# Patient Record
Sex: Male | Born: 1950 | Race: White | Hispanic: No | Smoking: Former smoker
Health system: Southern US, Community
[De-identification: ages and names within clinical notes are randomized; demographics above are authoritative.]

## PROBLEM LIST (undated history)

## (undated) DIAGNOSIS — M109 Gout, unspecified: Secondary | ICD-10-CM

## (undated) HISTORY — PX: HERNIA REPAIR: SHX51

## (undated) HISTORY — PX: KNEE SURGERY: SHX244

## (undated) HISTORY — PX: TONSILECTOMY/ADENOIDECTOMY WITH MYRINGOTOMY: SHX6125

## (undated) HISTORY — PX: VASECTOMY: SHX75

---
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
## 2003-11-06 ENCOUNTER — Ambulatory Visit (HOSPITAL_COMMUNITY): Admission: RE | Admit: 2003-11-06 | Discharge: 2003-11-06 | Payer: Self-pay | Admitting: Internal Medicine

## 2004-08-29 ENCOUNTER — Ambulatory Visit (HOSPITAL_COMMUNITY): Admission: RE | Admit: 2004-08-29 | Discharge: 2004-08-29 | Payer: Self-pay | Admitting: Internal Medicine

## 2004-12-07 ENCOUNTER — Ambulatory Visit: Payer: Self-pay | Admitting: Internal Medicine

## 2005-01-04 ENCOUNTER — Ambulatory Visit (HOSPITAL_COMMUNITY): Admission: RE | Admit: 2005-01-04 | Discharge: 2005-01-04 | Payer: Self-pay | Admitting: Internal Medicine

## 2005-01-04 ENCOUNTER — Ambulatory Visit: Payer: Self-pay | Admitting: Internal Medicine

## 2009-09-22 ENCOUNTER — Ambulatory Visit: Payer: Self-pay | Admitting: Orthopedic Surgery

## 2009-09-22 DIAGNOSIS — M6580 Other synovitis and tenosynovitis, unspecified site: Secondary | ICD-10-CM | POA: Insufficient documentation

## 2010-08-29 ENCOUNTER — Other Ambulatory Visit: Admission: RE | Admit: 2010-08-29 | Discharge: 2010-08-29 | Payer: Self-pay | Admitting: Orthopaedic Surgery

## 2011-05-05 NOTE — Consult Note (Signed)
[name], [name]                 ACCOUNT NO.:  [id number]   MEDICAL RECORD NO.:  [id number]          PATIENT TYPE:  OUT   LOCATION:  RAD                           FACILITY:  [facility]   PHYSICIAN:  Lionel December, M.D.    DATE OF BIRTH:  January 21, 1951   DATE OF CONSULTATION:  DATE OF DISCHARGE:                                   CONSULTATION   HISTORY OF PRESENT ILLNESS:  [name] is a 60 year old gentleman who has had  chronic intermittent hematochezia.  This has been going on for over 15  years.  He was seen previously by Dr. Karilyn Cota and underwent colonoscopy on  November 17, 1994, which revealed a small 4 to 5 mm polyp in the sigmoid  colon which was hyperplastic.  He also has external hemorrhoids.  A flexible  sigmoidoscopy prior to that revealed distal proctitis and external  hemorrhoids.  He is having pain with rectal bleeding.  He describes it as  small to moderate volume at times.  He denies any rectal pain, abdominal  pain, constipation, diarrhea, melena, nausea or vomiting.  He has only  intermittent heartburn symptoms.  He recently had a rectal examination by  Dr. Ouida Sills which revealed external hemorrhoids and heme-positive stool.   CURRENT MEDICATIONS:  1.  Multivitamin daily.  2.  Liquid ___________.   ALLERGIES:  PENICILLIN.   PAST MEDICAL HISTORY:  Hemorrhoids.  Hepatitis A at age 23, hyperlipidemia,  diet controlled.  Gout.  Status post right inguinal hernia repair.  He also  had surgery for a broken collar bone.   FAMILY HISTORY:  Mother had breast cancer and current history.  Father had  leukemia.   SOCIAL HISTORY:  He is divorced.  Quit smoking about two years ago.  He  denies any alcohol use.   REVIEW OF SYSTEMS:  See HPI for GI.  Cardiopulmonary:  Denies any chest pain  or shortness of breath.   PHYSICAL EXAMINATION:  VITAL SIGNS:  Weight 223 pounds.  Temperature 97.5,  blood pressure 112/70, pulse 64.  GENERAL:  A pleasant, well-nourished, well-developed Caucasian  male, in no  acute distress.  SKIN:  Warm and dry, no jaundice.  HEENT:  Conjunctivae are pink.  Sclerae are nonicteric. Oropharyngeal mucosa  is moist and pink, no lesions, erythema or exudate.  No lymphadenopathy or  thyromegaly.  CHEST:  Lungs are clear to auscultation.  CARDIAC:  Exam reveals regular rate and rhythm, normal S1 and S2.  No  murmurs, rubs or gallops.  ABDOMEN:  Positive bowel sounds, soft, nontender, nondistended.  No  organomegaly or masses.  EXTREMITIES:  No edema.   IMPRESSION:  [name] is a 60 year old gentleman with chronic intermittent  hematochezia felt to be due to hemorrhoids.  He recently had hemoccult-  positive stool on rectal examination.  Given that it has been 10 years since  his last colonoscopy, he is due for one now.   PLAN:  1.  Colonoscopy in the near future.  2.  We will obtain most recent CBC or H&H from Dr. Alonza Smoker office.     Lesl  LL/MEDQ  D:  12/07/2004  T:  12/07/2004  Job:  [id number]   cc:   Kingsley Callander. Ouida Sills, MD  [address]  [place]  [place] [address]  Fax: [phone]

## 2011-05-05 NOTE — Op Note (Signed)
NAMEJEREMAIH, KLIMA                 ACCOUNT NO.:  [id number]   MEDICAL RECORD NO.:  [id number]          PATIENT TYPE:  AMB   LOCATION:  DAY                           FACILITY:  [facility]   PHYSICIAN:  Lionel December, M.D.    DATE OF BIRTH:  [birth year]   DATE OF PROCEDURE:  01/04/2005  DATE OF DISCHARGE:                                 OPERATIVE REPORT   PROCEDURE:  Total colonoscopy.   INDICATIONS:  [name] is a 60 year old Caucasian male with  chronic/intermittent hematochezia, who is undergoing a diagnostic  colonoscopy.  He has history of constipation but states lately his bowels  have been moving regularly.  His last colonoscopy was 10 years ago, and he  had a small hyperplastic polyp removed.  Family history is negative for  colorectal carcinoma, but his father did have colonic polyps.  The procedure  risks were reviewed with the patient and informed consent was obtained.   PREMEDICATION:  Demerol 50 mg IV, Versed 5 mg IV.   FINDINGS:  Procedure performed in endoscopy suite.  The patient's vital  signs and O2 saturation were monitored during procedure and remained stable.  The patient was placed in left lateral recumbent position and a rectal  examination performed.  No abnormality noted on external or digital exam.  The Olympus video scope was placed in the rectum and advanced under vision  in the sigmoid colon and beyond.  Preparation was satisfactory.  He had  diffuse pigmentation consistent with melanosis coli.  He had few scattered  diverticula throughout the colon, and most of these are in the proximal  transverse colon.  The scope was passed to the cecum, which was identified  by appendiceal orifice and ileocecal valve.  Pictures taken for the record.  The mucosa was examined for the second time on the way out, and there were  no polyps and/or tumor masses.  Rectal mucosa as normal.  The scope was  retroflexed to examine the anorectal junction, and he had small to moderate-  sized hemorrhoids below the dentate line.  The endoscope was straightened  and withdrawn.  The patient tolerated the procedure well.   FINAL DIAGNOSIS:  1.  Pancolonic diverticulosis.  He only has a few scattered diverticula.  2.  Melanosis coli.  3.  External hemorrhoids, felt to be the source of his hematochezia.   RECOMMENDATIONS:  1.  A high-fiber diet.  2.  Citrucel or equivalent one tablespoonful daily.  If necessary, he can      also take Colace two tablets at bedtime.  3.  He should continue yearly Hemoccults and consider next colonoscopy in 10      years from now.      NR/MEDQ  D:  01/04/2005  T:  01/04/2005  Job:  [id number]   cc:   Kingsley Callander. Ouida Sills, MD  [address]  [place]  [place] [address]  Fax: [phone]

## 2012-10-24 ENCOUNTER — Ambulatory Visit (INDEPENDENT_AMBULATORY_CARE_PROVIDER_SITE_OTHER): Payer: BC Managed Care – PPO | Admitting: Orthopedic Surgery

## 2012-10-24 ENCOUNTER — Encounter: Payer: Self-pay | Admitting: Orthopedic Surgery

## 2012-10-24 ENCOUNTER — Ambulatory Visit (INDEPENDENT_AMBULATORY_CARE_PROVIDER_SITE_OTHER): Payer: BC Managed Care – PPO

## 2012-10-24 VITALS — Ht 76.0 in | Wt 220.0 lb

## 2012-10-24 DIAGNOSIS — M23359 Other meniscus derangements, posterior horn of lateral meniscus, unspecified knee: Secondary | ICD-10-CM

## 2012-10-24 DIAGNOSIS — M25569 Pain in unspecified knee: Secondary | ICD-10-CM

## 2012-10-24 NOTE — Progress Notes (Signed)
Patient ID: [name], male   DOB: 03-27-1951, 61 y.o.   MRN: [id number] New patient visit  61 year old male seen over 3 years ago for pain on the lateral side of his left knee was treated for iliotibial band friction syndrome presents now with continued pain in the same area with dull throbbing burning sensation over the lateral joint line with catching and locking when he twists bends or crosses his leg. The pain is worse with activity relieved by rest. Intensity of pain 5/10. Previous treatment includes anti-inflammatories, therapy, activity modification including stopping riding bikes. Current review of systems are ordering of the eyes stiffness of the joint numbness and tingling in the leg allergy to penicillin  History reviewed. No pertinent past medical history.  Past Surgical History  Procedure Date  . Hernia repair   . Tonsilectomy/adenoidectomy with myringotomy   . Knee surgery     Ht 6\' 4"  (1.93 m)  Wt 220 lb (99.791 kg)  BMI 26.78 kg/m2 He is a well-developed well-nourished male grooming and hygiene is intact. He is awake alert and oriented x3 mood and affect are normal. He ambulates with slight limp.  He has a slight swelling over the lateral joint line tenderness. He has full range of motion painful flexion test 120. The knee remains stable strength is normal skin is intact his McMurray sign is positive for lateral joint line pain and clicking and popping;  sensation is normal no pathologic reflexes were elicited. Lymph nodes are negative. Balance is normal.  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

## 2012-10-24 NOTE — Patient Instructions (Addendum)
You have been scheduled for an MRI scan.  Your insurance company requires a precertification prior to scheduling the MRI.  If the MRI scan is not approved we will let you know and make further treatment recommendations according to your insurance's guidelines.   We will schedule you for another  appointment to review the results and make further treatment recommendations  

## 2012-10-28 ENCOUNTER — Telehealth: Payer: Self-pay | Admitting: Radiology

## 2012-10-28 NOTE — Telephone Encounter (Signed)
Patient has MRI appointment at [facility] on 10-30-12 at 2:45. Patient has [employer], authorization # [id number] and it expires on 11-26-12. Patient will follow back up in the office for results.

## 2012-10-30 ENCOUNTER — Ambulatory Visit (HOSPITAL_COMMUNITY)
Admission: RE | Admit: 2012-10-30 | Discharge: 2012-10-30 | Disposition: A | Discharge status: Home / Self Care | Payer: BC Managed Care – PPO | Source: Ambulatory Visit | Attending: Orthopedic Surgery | Admitting: Orthopedic Surgery

## 2012-10-30 ENCOUNTER — Ambulatory Visit (HOSPITAL_COMMUNITY): Admission: RE | Admit: 2012-10-30 | Payer: BC Managed Care – PPO | Source: Ambulatory Visit

## 2012-10-30 DIAGNOSIS — M1710 Unilateral primary osteoarthritis, unspecified knee: Secondary | ICD-10-CM | POA: Insufficient documentation

## 2012-10-30 DIAGNOSIS — M1120 Other chondrocalcinosis, unspecified site: Secondary | ICD-10-CM | POA: Insufficient documentation

## 2012-10-30 DIAGNOSIS — IMO0002 Reserved for concepts with insufficient information to code with codable children: Secondary | ICD-10-CM | POA: Insufficient documentation

## 2012-10-30 DIAGNOSIS — M7120 Synovial cyst of popliteal space [Baker], unspecified knee: Secondary | ICD-10-CM | POA: Insufficient documentation

## 2012-10-30 DIAGNOSIS — M25569 Pain in unspecified knee: Secondary | ICD-10-CM | POA: Insufficient documentation

## 2012-10-30 DIAGNOSIS — M23359 Other meniscus derangements, posterior horn of lateral meniscus, unspecified knee: Secondary | ICD-10-CM

## 2012-11-06 ENCOUNTER — Encounter: Payer: Self-pay | Admitting: Orthopedic Surgery

## 2012-11-06 ENCOUNTER — Ambulatory Visit (INDEPENDENT_AMBULATORY_CARE_PROVIDER_SITE_OTHER): Payer: BC Managed Care – PPO | Admitting: Orthopedic Surgery

## 2012-11-06 VITALS — Ht 76.0 in | Wt 220.0 lb

## 2012-11-06 DIAGNOSIS — M1712 Unilateral primary osteoarthritis, left knee: Secondary | ICD-10-CM

## 2012-11-06 DIAGNOSIS — M1710 Unilateral primary osteoarthritis, unspecified knee: Secondary | ICD-10-CM

## 2012-11-06 DIAGNOSIS — IMO0002 Reserved for concepts with insufficient information to code with codable children: Secondary | ICD-10-CM

## 2012-11-06 NOTE — Patient Instructions (Addendum)
activities as tolerated 

## 2012-11-06 NOTE — Progress Notes (Signed)
Patient ID: [name], male   DOB: [birth year], 61 y.o.   MRN: [id number] Chief Complaint  Patient presents with  . Follow-up    MRI results of left knee.    IMPRESSION:  1. I suspect a large partial medial meniscectomy, with only a  minuscule strand of residual meniscal tissue in the posterior horn  and midbody, and a small amount of residual anterior horn tissue.  2. Lateral meniscal chondrocalcinosis likely accounts for the high  signal centrally in the meniscus. Slight free edge irregularity  may represent fraying.  3. Osteoarthritis.  4. Small knee effusion with small Baker's cyst.  5. Mildly thickened medial plica.  Patient supine and lateral knee pain but eventually got better when he crossed and uncrosses leg and her loud pop  He does not have a torn meniscus so I suspect this represents degenerative change in the lateral compartment  He did not want an injection today since his knee is getting better  I asked him to call us back if things worsen and he would like to try injection  At this point I do not think arthroscopic surgery would help he does have increased arthrosis in the medial compartment secondary to previous medial meniscectomy from an arthroscopic procedure done 20 years ago  At this point we will follow him on an as-needed basis

## 2013-06-24 NOTE — H&P (Signed)
  NTS SOAP Note  Vital Signs:  Vitals as of: 06/24/2013: Systolic 128: Diastolic 77: Heart Rate 55: Temp 98.76F: Height 41ft 4in: Weight 217Lbs 0 Ounces: Pain Level 4: BMI 26.41  BMI : 26.41 kg/m2  Subjective: This 62 Years 67 Months old Male presents for of    HEMORRHOIDS: ,Present with protruding hemorrhoids.  States they have been problematic for some time now.  Occassional blood on toilet paper when he wipes himself.  Has had a thrombosed hemorrhoid lanced years ago.  Had TCS five years ago.  Review of Symptoms:  Constitutional:unremarkable   Head:unremarkable    Eyes:unremarkable   Nose/Mouth/Throat:unremarkable Cardiovascular:  unremarkable   Respiratory:unremarkable   Gastrointestinal:  unremarkable   Genitourinary:unremarkable     Musculoskeletal:unremarkable   Skin:unremarkable Hematolgic/Lymphatic:unremarkable     Allergic/Immunologic:unremarkable     Past Medical History:    Reviewed   Past Medical History  Surgical History: hernia, knee surgery Medical Problems: none Allergies: pcn Medications: none   Social History:Reviewed  Social History  Preferred Language: English Race:  White Ethnicity: Not Hispanic / Latino Age: 62 Years 9 Months Marital Status:  S Alcohol: socially Recreational drug(s):  No   Smoking Status: Unknown if ever smoked reviewed on 06/24/2013 Functional Status reviewed on mm/dd/yyyy ------------------------------------------------ Bathing: Normal Cooking: Normal Dressing: Normal Driving: Normal Eating: Normal Managing Meds: Normal Oral Care: Normal Shopping: Normal Toileting: Normal Transferring: Normal Walking: Normal Cognitive Status reviewed on mm/dd/yyyy ------------------------------------------------ Attention: Normal Decision Making: Normal Language: Normal Memory: Normal Motor: Normal Perception: Normal Problem Solving: Normal Visual and Spatial: Normal   Family  History:  Reviewed  Family Health History Mother, Deceased; Cancer unspecified;  Father, Deceased; Leukemia;     Objective Information: General:  Well appearing, well nourished in no distress. Heart:  RRR, no murmur Lungs:    CTA bilaterally, no wheezes, rhonchi, rales.  Breathing unlabored.   Internal and external hemorrhoid noted posteriorly.  Normal sphincter tone, heme negative  Assessment:Hemorrhoidal disease  Diagnosis &amp; Procedure Smart Code   Plan:Scheduled for hemorrhoidectomy on 07/04/13.   Patient Education:Alternative treatments to surgery were discussed with patient (and family).  Risks and benefits  of procedure including bleeding and recurrence of the hemorrhoids were fully explained to the patient (and family) who gave informed consent. Patient/family questions were addressed.  Follow-up:Pending Surgery

## 2013-06-26 ENCOUNTER — Encounter (HOSPITAL_COMMUNITY): Payer: Self-pay | Admitting: Pharmacy Technician

## 2013-06-27 NOTE — Patient Instructions (Signed)
[name]  06/27/2013   Your procedure is scheduled on:  07/04/13  Report to [facility] at 07:15 AM.  Call this number if you have problems the morning of surgery: [phone]   Remember:   Do not eat food or drink liquids after midnight.   Take these medicines the morning of surgery with A SIP OF WATER: None   Do not wear jewelry, make-up or nail polish.  Do not wear lotions, powders, or perfumes.   Do not shave 48 hours prior to surgery. Men may shave face and neck.  Do not bring valuables to the hospital.  [facility] is not responsible for any belongings or valuables.  Contacts, dentures or bridgework may not be worn into surgery.  Leave suitcase in the car. After surgery it may be brought to your room.  For patients admitted to the hospital, checkout time is 11:00 AM the day of discharge.   Patients discharged the day of surgery will not be allowed to drive home.   Special Instructions: Shower using CHG 2 nights before surgery and the night before surgery.  If you shower the day of surgery use CHG.  Use special wash - you have one bottle of CHG for all showers.  You should use approximately 1/3 of the bottle for each shower.   Please read over the following fact sheets that you were given: Pain Booklet, Surgical Site Infection Prevention, Anesthesia Post-op Instructions and Care and Recovery After Surgery    Hemorrhoidectomy Hemorrhoidectomy is surgery to remove hemorrhoids. Hemorrhoids are veins that have become swollen in the rectum. The rectum is the area from the bottom end of the intestines to the opening where bowel movements leave the body. Hemorrhoids can be uncomfortable. They can cause itching, bleeding and pain if a blood clot forms in them (thrombose). If hemorrhoids are small, surgery may not be needed. But if they cover a larger area, surgery is usually suggested.  LET YOUR CAREGIVER KNOW ABOUT:   Any allergies.  All medications you are taking, including:  Herbs,  eyedrops, over-the-counter medications and creams.  Blood thinners (anticoagulants), aspirin or other drugs that could affect blood clotting.  Use of steroids (by mouth or as creams).  Previous problems with anesthetics, including local anesthetics.  Possibility of pregnancy, if this applies.  Any history of blood clots.  Any history of bleeding or other blood problems.  Previous surgery.  Smoking history.  Other health problems. RISKS AND COMPLICATIONS All surgery carries some risk. However, hemorrhoid surgery usually goes smoothly. Possible complications could include:  Urinary retention.  Bleeding.  Infection.  A painful incision.  A reaction to the anesthesia (this is not common). BEFORE THE PROCEDURE   Stop using aspirin and non-steroidal anti-inflammatory drugs (NSAIDs) for pain relief. This includes prescription drugs and over-the-counter drugs such as ibuprofen and naproxen. Also stop taking vitamin E. If possible, do this two weeks before your surgery.  If you take blood-thinners, ask your healthcare provider when you should stop taking them.  You will probably have blood and urine tests done several days before your surgery.  Do not eat or drink for about 8 hours before the surgery.  Arrive at least an hour before the surgery, or whenever your surgeon recommends. This will give you time to check in and fill out any needed paperwork.  Hemorrhoidectomy is often an outpatient procedure. This means you will be able to go home the same day. Sometimes, though, people stay overnight in the hospital  after the procedure. Ask your surgeon what to expect. Either way, make arrangements in advance for someone to drive you home. PROCEDURE   The preparation:  You will change into a hospital gown.  You will be given an IV. A needle will be inserted in your arm. Medication can flow directly into your body through this needle.  You might be given an enema to clear your  rectum.  Once in the operating room, you will probably lie on your side or be repositioned later to lying on your stomach.  You will be given anesthesia (medication) so you will not feel anything during the surgery. The surgery often is done with local anesthesia (the area near the hemorrhoids will be numb and you will be drowsy but awake). Sometimes, general anesthesia is used (you will be asleep during the procedure).  The procedure:  There are a few different procedures for hemorrhoids. Be sure to ask you surgeon about the procedure, the risks and benefits.  Be sure to ask about what you need to do to take care of the wound, if there is one. AFTER THE PROCEDURE  You will stay in a recovery area until the anesthesia has worn off. Your blood pressure and pulse will be checked every so often.  You may feel a lot of pain in the area of the rectum.  Take all pain medication prescribed by your surgeon. Ask before taking any over-the-counter pain medicines.  Sometimes sitting in a warm bath can help relieve your pain.  To make sure you have bowel movements without straining:  You will probably need to take stool softeners (usually a pill) for a few days.  You should drink 8 to 10 glasses of water each day.  Your activity will be restricted for awhile. Ask your caregiver for a list of what you should and should not do while you recover. Document Released: 10/01/2009 Document Revised: 02/26/2012 Document Reviewed: 10/01/2009 [facility] Patient Information 2014 [place], [place].    PATIENT INSTRUCTIONS POST-ANESTHESIA  IMMEDIATELY FOLLOWING SURGERY:  Do not drive or operate machinery for the first twenty four hours after surgery.  Do not make any important decisions for twenty four hours after surgery or while taking narcotic pain medications or sedatives.  If you develop intractable nausea and vomiting or a severe headache please notify your doctor immediately.  FOLLOW-UP:  Please make  an appointment with your surgeon as instructed. You do not need to follow up with anesthesia unless specifically instructed to do so.  WOUND CARE INSTRUCTIONS (if applicable):  Keep a dry clean dressing on the anesthesia/puncture wound site if there is drainage.  Once the wound has quit draining you may leave it open to air.  Generally you should leave the bandage intact for twenty four hours unless there is drainage.  If the epidural site drains for more than 36-48 hours please call the anesthesia department.  QUESTIONS?:  Please feel free to call your physician or the hospital operator if you have any questions, and they will be happy to assist you.

## 2013-06-30 ENCOUNTER — Other Ambulatory Visit: Payer: Self-pay

## 2013-06-30 ENCOUNTER — Encounter (HOSPITAL_COMMUNITY): Payer: Self-pay

## 2013-06-30 ENCOUNTER — Encounter (HOSPITAL_COMMUNITY)
Admission: RE | Admit: 2013-06-30 | Discharge: 2013-06-30 | Disposition: A | Discharge status: Home / Self Care | Payer: BC Managed Care – PPO | Source: Ambulatory Visit | Attending: General Surgery | Admitting: General Surgery

## 2013-06-30 LAB — CBC WITH DIFFERENTIAL/PLATELET
Basophils Absolute: 0.1 10*3/uL (ref 0.0–0.1)
Basophils Relative: 1 % (ref 0–1)
Eosinophils Absolute: 0.4 10*3/uL (ref 0.0–0.7)
Eosinophils Relative: 4 % (ref 0–5)
HCT: 42.7 % (ref 39.0–52.0)
Hemoglobin: 14.7 g/dL (ref 13.0–17.0)
Lymphocytes Relative: 23 % (ref 12–46)
Lymphs Abs: 2.2 10*3/uL (ref 0.7–4.0)
MCH: 29.9 pg (ref 26.0–34.0)
MCHC: 34.4 g/dL (ref 30.0–36.0)
MCV: 87 fL (ref 78.0–100.0)
Monocytes Absolute: 1 10*3/uL (ref 0.1–1.0)
Monocytes Relative: 10 % (ref 3–12)
Neutro Abs: 6.3 10*3/uL (ref 1.7–7.7)
Neutrophils Relative %: 64 % (ref 43–77)
Platelets: 261 10*3/uL (ref 150–400)
RBC: 4.91 MIL/uL (ref 4.22–5.81)
RDW: 12.4 % (ref 11.5–15.5)
WBC: 9.9 10*3/uL (ref 4.0–10.5)

## 2013-06-30 LAB — BASIC METABOLIC PANEL
BUN: 14 mg/dL (ref 6–23)
CO2: 26 mEq/L (ref 19–32)
Calcium: 9.9 mg/dL (ref 8.4–10.5)
Chloride: 104 mEq/L (ref 96–112)
Creatinine, Ser: 1.13 mg/dL (ref 0.50–1.35)
GFR calc Af Amer: 80 mL/min — ABNORMAL LOW (ref >90)
GFR calc non Af Amer: 69 mL/min — ABNORMAL LOW (ref >90)
Glucose, Bld: 110 mg/dL — ABNORMAL HIGH (ref 70–99)
Potassium: 4.2 mEq/L (ref 3.5–5.1)
Sodium: 139 mEq/L (ref 135–145)

## 2013-07-04 ENCOUNTER — Encounter (HOSPITAL_COMMUNITY)
Admission: RE | Disposition: A | Discharge status: Home / Self Care | Payer: Self-pay | Source: Ambulatory Visit | Attending: General Surgery

## 2013-07-04 ENCOUNTER — Encounter (HOSPITAL_COMMUNITY): Payer: Self-pay | Admitting: Anesthesiology

## 2013-07-04 ENCOUNTER — Ambulatory Visit (HOSPITAL_COMMUNITY)
Admission: RE | Admit: 2013-07-04 | Discharge: 2013-07-04 | Disposition: A | Discharge status: Home / Self Care | Payer: BC Managed Care – PPO | Source: Ambulatory Visit | Attending: General Surgery | Admitting: General Surgery

## 2013-07-04 ENCOUNTER — Ambulatory Visit (HOSPITAL_COMMUNITY): Payer: BC Managed Care – PPO | Admitting: Anesthesiology

## 2013-07-04 DIAGNOSIS — K644 Residual hemorrhoidal skin tags: Secondary | ICD-10-CM | POA: Insufficient documentation

## 2013-07-04 DIAGNOSIS — Z87891 Personal history of nicotine dependence: Secondary | ICD-10-CM | POA: Insufficient documentation

## 2013-07-04 DIAGNOSIS — K648 Other hemorrhoids: Secondary | ICD-10-CM | POA: Insufficient documentation

## 2013-07-04 HISTORY — PX: HEMORRHOID SURGERY: SHX153

## 2013-07-04 SURGERY — HEMORRHOIDECTOMY
Anesthesia: General | Site: Rectum | Wound class: Clean Contaminated

## 2013-07-04 MED ORDER — ARTIFICIAL TEARS OP OINT
TOPICAL_OINTMENT | OPHTHALMIC | Status: AC
  Filled 2013-07-04: qty 3.5

## 2013-07-04 MED ORDER — LIDOCAINE HCL (CARDIAC) 20 MG/ML IV SOLN
INTRAVENOUS | Status: DC | PRN
  Administered 2013-07-04: 50 mg via INTRAVENOUS

## 2013-07-04 MED ORDER — GLYCOPYRROLATE 0.2 MG/ML IJ SOLN
INTRAMUSCULAR | Status: AC
  Filled 2013-07-04: qty 1

## 2013-07-04 MED ORDER — BUPIVACAINE HCL (PF) 0.5 % IJ SOLN
INTRAMUSCULAR | Status: DC | PRN
  Administered 2013-07-04: 7 mL

## 2013-07-04 MED ORDER — SODIUM CHLORIDE 0.9 % IR SOLN
Status: DC | PRN
  Administered 2013-07-04: 1000 mL

## 2013-07-04 MED ORDER — MIDAZOLAM HCL 2 MG/2ML IJ SOLN
INTRAMUSCULAR | Status: AC
  Filled 2013-07-04: qty 2

## 2013-07-04 MED ORDER — OXYCODONE-ACETAMINOPHEN 7.5-325 MG PO TABS: 1.0000 | ORAL_TABLET | ORAL | Status: AC | PRN

## 2013-07-04 MED ORDER — PROPOFOL 10 MG/ML IV BOLUS
INTRAVENOUS | Status: DC | PRN
  Administered 2013-07-04: 150 mg via INTRAVENOUS

## 2013-07-04 MED ORDER — FENTANYL CITRATE 0.05 MG/ML IJ SOLN
25.0000 ug | INTRAMUSCULAR | Status: AC
  Administered 2013-07-04: 25 ug via INTRAVENOUS

## 2013-07-04 MED ORDER — FENTANYL CITRATE 0.05 MG/ML IJ SOLN
INTRAMUSCULAR | Status: AC
  Filled 2013-07-04: qty 2

## 2013-07-04 MED ORDER — KETOROLAC TROMETHAMINE 30 MG/ML IJ SOLN
INTRAMUSCULAR | Status: AC
  Filled 2013-07-04: qty 1

## 2013-07-04 MED ORDER — ONDANSETRON HCL 4 MG/2ML IJ SOLN: 4.0000 mg | Freq: Once | INTRAMUSCULAR | Status: DC | PRN

## 2013-07-04 MED ORDER — LIDOCAINE VISCOUS 2 % MT SOLN
OROMUCOSAL | Status: DC | PRN
  Administered 2013-07-04: 1

## 2013-07-04 MED ORDER — METRONIDAZOLE IN NACL 5-0.79 MG/ML-% IV SOLN
500.0000 mg | INTRAVENOUS | Status: AC
  Administered 2013-07-04: 500 mg via INTRAVENOUS

## 2013-07-04 MED ORDER — METRONIDAZOLE IN NACL 5-0.79 MG/ML-% IV SOLN
INTRAVENOUS | Status: AC
  Filled 2013-07-04: qty 100

## 2013-07-04 MED ORDER — PROPOFOL 10 MG/ML IV EMUL
INTRAVENOUS | Status: AC
  Filled 2013-07-04: qty 20

## 2013-07-04 MED ORDER — FENTANYL CITRATE 0.05 MG/ML IJ SOLN
INTRAMUSCULAR | Status: DC | PRN
  Administered 2013-07-04: 50 ug via INTRAVENOUS
  Administered 2013-07-04 (×4): 25 ug via INTRAVENOUS
  Administered 2013-07-04: 50 ug via INTRAVENOUS

## 2013-07-04 MED ORDER — ONDANSETRON HCL 4 MG/2ML IJ SOLN
INTRAMUSCULAR | Status: AC
  Filled 2013-07-04: qty 2

## 2013-07-04 MED ORDER — LACTATED RINGERS IV SOLN
INTRAVENOUS | Status: DC | PRN
  Administered 2013-07-04 (×2): via INTRAVENOUS

## 2013-07-04 MED ORDER — HEMOSTATIC AGENTS (NO CHARGE) OPTIME
TOPICAL | Status: DC | PRN
  Administered 2013-07-04: 1 via TOPICAL

## 2013-07-04 MED ORDER — ONDANSETRON HCL 4 MG/2ML IJ SOLN
4.0000 mg | Freq: Once | INTRAMUSCULAR | Status: AC
  Administered 2013-07-04: 4 mg via INTRAVENOUS

## 2013-07-04 MED ORDER — LIDOCAINE VISCOUS 2 % MT SOLN
OROMUCOSAL | Status: AC
  Filled 2013-07-04: qty 15

## 2013-07-04 MED ORDER — MIDAZOLAM HCL 2 MG/2ML IJ SOLN
1.0000 mg | INTRAMUSCULAR | Status: DC | PRN
  Administered 2013-07-04: 2 mg via INTRAVENOUS

## 2013-07-04 MED ORDER — CHLORHEXIDINE GLUCONATE 4 % EX LIQD: 1.0000 "application " | Freq: Once | CUTANEOUS | Status: DC

## 2013-07-04 MED ORDER — NALOXONE HCL 0.4 MG/ML IJ SOLN
INTRAMUSCULAR | Status: AC
  Filled 2013-07-04: qty 1

## 2013-07-04 MED ORDER — KETOROLAC TROMETHAMINE 30 MG/ML IJ SOLN
30.0000 mg | Freq: Once | INTRAMUSCULAR | Status: AC
  Administered 2013-07-04: 30 mg via INTRAVENOUS

## 2013-07-04 MED ORDER — BUPIVACAINE HCL (PF) 0.5 % IJ SOLN
INTRAMUSCULAR | Status: AC
  Filled 2013-07-04: qty 30

## 2013-07-04 MED ORDER — GLYCOPYRROLATE 0.2 MG/ML IJ SOLN
INTRAMUSCULAR | Status: DC | PRN
  Administered 2013-07-04 (×2): 0.2 mg via INTRAVENOUS

## 2013-07-04 MED ORDER — FENTANYL CITRATE 0.05 MG/ML IJ SOLN
25.0000 ug | INTRAMUSCULAR | Status: DC | PRN
  Administered 2013-07-04 (×2): 50 ug via INTRAVENOUS

## 2013-07-04 MED ORDER — LACTATED RINGERS IV SOLN
INTRAVENOUS | Status: DC
  Administered 2013-07-04: 09:00:00 via INTRAVENOUS

## 2013-07-04 SURGICAL SUPPLY — 29 items
BAG HAMPER (MISCELLANEOUS) ×2 IMPLANT
CLOTH BEACON ORANGE TIMEOUT ST (SAFETY) ×2 IMPLANT
COVER LIGHT HANDLE STERIS (MISCELLANEOUS) ×4 IMPLANT
DECANTER SPIKE VIAL GLASS SM (MISCELLANEOUS) ×2 IMPLANT
DRAPE PROXIMA HALF (DRAPES) ×2 IMPLANT
ELECT REM PT RETURN 9FT ADLT (ELECTROSURGICAL) ×2
ELECTRODE REM PT RTRN 9FT ADLT (ELECTROSURGICAL) ×1 IMPLANT
FORMALIN 10 PREFIL 120ML (MISCELLANEOUS) ×2 IMPLANT
GLOVE BIO SURGEON STRL SZ7.5 (GLOVE) ×2 IMPLANT
GLOVE BIOGEL PI IND STRL 7.5 (GLOVE) IMPLANT
GLOVE BIOGEL PI INDICATOR 7.5 (GLOVE) ×1
GLOVE ECLIPSE 7.0 STRL STRAW (GLOVE) ×1 IMPLANT
GLOVE EXAM NITRILE MD LF STRL (GLOVE) ×1 IMPLANT
GOWN STRL REIN XL XLG (GOWN DISPOSABLE) ×5 IMPLANT
HEMOSTAT SURGICEL 4X8 (HEMOSTASIS) ×2 IMPLANT
KIT ROOM TURNOVER AP CYSTO (KITS) ×2 IMPLANT
LIGASURE IMPACT 36 18CM CVD LR (INSTRUMENTS) ×1 IMPLANT
MANIFOLD NEPTUNE II (INSTRUMENTS) ×2 IMPLANT
NDL HYPO 25X1 1.5 SAFETY (NEEDLE) ×1 IMPLANT
NEEDLE HYPO 25X1 1.5 SAFETY (NEEDLE) ×2 IMPLANT
NS IRRIG 1000ML POUR BTL (IV SOLUTION) ×2 IMPLANT
PACK PERI GYN (CUSTOM PROCEDURE TRAY) ×2 IMPLANT
PAD ARMBOARD 7.5X6 YLW CONV (MISCELLANEOUS) ×2 IMPLANT
SET BASIN LINEN APH (SET/KITS/TRAYS/PACK) ×2 IMPLANT
SHEARS HARMONIC 9CM CVD (BLADE) IMPLANT
SPONGE GAUZE 4X4 12PLY (GAUZE/BANDAGES/DRESSINGS) ×2 IMPLANT
SUT SILK 0 FSL (SUTURE) ×2 IMPLANT
SUT VIC AB 2-0 CT2 27 (SUTURE) IMPLANT
SYR CONTROL 10ML LL (SYRINGE) ×2 IMPLANT

## 2013-07-04 NOTE — Transfer of Care (Signed)
Immediate Anesthesia Transfer of Care Note  Patient: [name]  Procedure(s) Performed: Procedure(s): HEMORRHOIDECTOMY (N/A)  Patient Location: PACU  Anesthesia Type:General  Level of Consciousness: awake, alert , oriented and patient cooperative  Airway & Oxygen Therapy: Patient Spontanous Breathing and Patient connected to face mask oxygen  Post-op Assessment: Report given to PACU RN and Post -op Vital signs reviewed and stable  Post vital signs: Reviewed and stable  Complications: No apparent anesthesia complications

## 2013-07-04 NOTE — Op Note (Signed)
Patient:  [name]  DOB:  Dec 26, 1950  MRN:  [id number]   Preop Diagnosis:  Hemorrhoidal disease  Postop Diagnosis:  Same  Procedure:  Extensive hemorrhoidectomy  Surgeon:  Franky Macho, M.D.  Anes:  General  Indications:  Patient is a 62 year old white male who presents with both internal and external hemorrhoidal disease at both the 4:00 and 7:00 positions. The risks and benefits of the procedure including bleeding, infection, and recurrence of hemorrhoidal disease were fully explained to the patient, who gave informed consent.  Procedure note:  The patient was placed in the lithotomy position after general anesthesia was administered. The perineum was prepped and draped using usual sterile technique with Betadine. Surgical site confirmation was performed.  On anoscopy, a large internal and external hemorrhoid was noted at the 4:00 position with an additional smaller external hemorrhoid noted at the 7 o'clock position. No other mass lesions were noted. Using the LigaSure, both areas were excised without difficulty. They were sent to pathology further examination. The internal sphincter was noted to be intact at the end of the procedure. 0.5% Sensorcaine was instilled the surrounding peritoneum. Surgicel and Viscous Xylocaine rectal packing was then placed.  All tape and needle counts were correct at the end of the procedure. Patient was awakened and transferred to PACU in stable condition.  Complications:  None  EBL:  Minimal  Specimen:  Hemorrhoids

## 2013-07-04 NOTE — Anesthesia Procedure Notes (Signed)
Procedure Name: LMA Insertion Date/Time: 07/04/2013 9:07 AM Performed by: Carolyne Littles, Lula Michaux L Pre-anesthesia Checklist: Patient identified, Timeout performed, Emergency Drugs available, Suction available and Patient being monitored Patient Re-evaluated:Patient Re-evaluated prior to inductionOxygen Delivery Method: Circle system utilized Preoxygenation: Pre-oxygenation with 100% oxygen Intubation Type: IV induction Ventilation: Mask ventilation without difficulty LMA Size: 5.0 Number of attempts: 1 Placement Confirmation: breath sounds checked- equal and bilateral and positive ETCO2 Tube secured with: Tape Dental Injury: Teeth and Oropharynx as per pre-operative assessment

## 2013-07-04 NOTE — Preoperative (Signed)
Beta Blockers   Reason not to administer Beta Blockers:Not Applicable 

## 2013-07-04 NOTE — Anesthesia Preprocedure Evaluation (Signed)
Anesthesia Evaluation  Patient identified by MRN, date of birth, ID band Patient awake    Reviewed: Allergy & Precautions, H&P , NPO status , Patient's Chart, lab work & pertinent test results  Airway Mallampati: I TM Distance: >3 FB Neck ROM: Full    Dental  (+) Teeth Intact   Pulmonary neg pulmonary ROS, former smoker,  breath sounds clear to auscultation        Cardiovascular negative cardio ROS  Rhythm:Regular Rate:Normal     Neuro/Psych    GI/Hepatic negative GI ROS,   Endo/Other    Renal/GU      Musculoskeletal   Abdominal   Peds  Hematology   Anesthesia Other Findings   Reproductive/Obstetrics                           Anesthesia Physical Anesthesia Plan  ASA: I  Anesthesia Plan: General   Post-op Pain Management:    Induction: Intravenous  Airway Management Planned: LMA  Additional Equipment:   Intra-op Plan:   Post-operative Plan: Extubation in OR  Informed Consent: I have reviewed the patients History and Physical, chart, labs and discussed the procedure including the risks, benefits and alternatives for the proposed anesthesia with the patient or authorized representative who has indicated his/her understanding and acceptance.     Plan Discussed with:   Anesthesia Plan Comments:         Anesthesia Quick Evaluation

## 2013-07-04 NOTE — Interval H&P Note (Signed)
History and Physical Interval Note:  07/04/2013 8:35 AM  [name]  has presented today for surgery, with the diagnosis of hemorrhoid  The various methods of treatment have been discussed with the patient and family. After consideration of risks, benefits and other options for treatment, the patient has consented to  Procedure(s): HEMORRHOIDECTOMY (N/A) as a surgical intervention .  The patient's history has been reviewed, patient examined, no change in status, stable for surgery.  I have reviewed the patient's chart and labs.  Questions were answered to the patient's satisfaction.     [name]

## 2013-07-04 NOTE — Anesthesia Postprocedure Evaluation (Signed)
  Anesthesia Post-op Note  Patient: [name]  Procedure(s) Performed: Procedure(s): HEMORRHOIDECTOMY (N/A)  Patient Location: PACU  Anesthesia Type:General  Level of Consciousness: awake, alert , oriented and patient cooperative  Airway and Oxygen Therapy: Patient Spontanous Breathing and Patient connected to face mask oxygen  Post-op Pain: mild  Post-op Assessment: Post-op Vital signs reviewed, Patient's Cardiovascular Status Stable, RESPIRATORY FUNCTION UNSTABLE, Patent Airway, No signs of Nausea or vomiting and Pain level controlled  Post-op Vital Signs: Reviewed and stable  Complications: No apparent anesthesia complications

## 2013-07-08 ENCOUNTER — Encounter (HOSPITAL_COMMUNITY): Payer: Self-pay | Admitting: General Surgery

## 2013-08-20 ENCOUNTER — Encounter: Payer: Self-pay | Admitting: Orthopedic Surgery

## 2013-08-20 ENCOUNTER — Ambulatory Visit (INDEPENDENT_AMBULATORY_CARE_PROVIDER_SITE_OTHER): Payer: BC Managed Care – PPO | Admitting: Orthopedic Surgery

## 2013-08-20 VITALS — BP 125/84 | Ht 76.0 in | Wt 213.0 lb

## 2013-08-20 DIAGNOSIS — M6530 Trigger finger, unspecified finger: Secondary | ICD-10-CM

## 2013-08-20 NOTE — Patient Instructions (Addendum)
You have received a steroid shot. 15% of patients experience increased pain at the injection site with in the next 24 hours. This is best treated with ice and tylenol extra strength 2 tabs every 8 hours. If you are still having pain please call the office.    

## 2013-08-20 NOTE — Progress Notes (Signed)
Patient ID: [name], male   DOB: 05/10/1951, 62 y.o.   MRN: [id number]  Chief Complaint  Patient presents with  . Hand Pain    Trigger finger left middle finger   HISTORY: 62 year old male presents with pain catching and locking of his left long finger x9 months which came on suddenly and is associated with dull 6/10 intermittent pain depending on his activity. Reports a catching and locking  Review of systems shortness of breath, numbness and tingling stiffness joint pain, ordering of the eyes. Other systems reviewed were negative. History of gout  History of hemorrhoidectomy herniorrhaphy and tonsillectomy family history of heart disease social history divorced, non smoker,  alcohol use no  Physical Exam(12)  Vital signs:   1.GENERAL: normal development   2. CDV: pulses are normal   3. Skin: normal  4. Lymph: nodes were not palpable/normal  5/6. Psychiatric: awake, alert and oriented, mood and affect normal   7. Neuro: normal sensation  8.   MSK  Gait: Normal but noncontributory 9.   Inspection and on the left hand shows catching and locking and tenderness over the A1 pulley with full range of motion on flexion strength and no instability   Imaging no imaging  Assessment: Left long finger    Plan: Injection left long finger triggering  Procedure note trigger finger injection   Procedure note trigger finger injection  Diagnosis trigger finger Postop diagnosis trigger finger Procedure injection of trigger finger Finger injected left long finger  Details of procedure: After verbal consent and timeout to confirm site the leftlong finger was injected with 1 cc of 40 mg of Depo-Medrol and 1 cc of 1% lidocaine The procedure was tolerated well without complication

## 2014-07-23 ENCOUNTER — Encounter: Payer: Self-pay | Admitting: Cardiovascular Disease

## 2014-07-23 ENCOUNTER — Ambulatory Visit (INDEPENDENT_AMBULATORY_CARE_PROVIDER_SITE_OTHER): Payer: BC Managed Care – PPO | Admitting: Cardiovascular Disease

## 2014-07-23 VITALS — BP 144/82 | HR 62 | Ht 76.0 in | Wt 212.0 lb

## 2014-07-23 DIAGNOSIS — R0609 Other forms of dyspnea: Secondary | ICD-10-CM

## 2014-07-23 DIAGNOSIS — I498 Other specified cardiac arrhythmias: Secondary | ICD-10-CM

## 2014-07-23 DIAGNOSIS — Z87891 Personal history of nicotine dependence: Secondary | ICD-10-CM

## 2014-07-23 DIAGNOSIS — R0989 Other specified symptoms and signs involving the circulatory and respiratory systems: Secondary | ICD-10-CM

## 2014-07-23 DIAGNOSIS — R001 Bradycardia, unspecified: Secondary | ICD-10-CM

## 2014-07-23 DIAGNOSIS — J449 Chronic obstructive pulmonary disease, unspecified: Secondary | ICD-10-CM

## 2014-07-23 DIAGNOSIS — IMO0001 Reserved for inherently not codable concepts without codable children: Secondary | ICD-10-CM

## 2014-07-23 DIAGNOSIS — R0600 Dyspnea, unspecified: Secondary | ICD-10-CM

## 2014-07-23 DIAGNOSIS — R030 Elevated blood-pressure reading, without diagnosis of hypertension: Secondary | ICD-10-CM

## 2014-07-23 DIAGNOSIS — R002 Palpitations: Secondary | ICD-10-CM

## 2014-07-23 NOTE — Patient Instructions (Signed)
Your physician recommends that you schedule a follow-up appointment in: only as needed.       Thank you for choosing [facility] !         

## 2014-07-23 NOTE — Progress Notes (Signed)
Patient ID: GRANTLEY SAVAGE, male   DOB: 04-12-1951, 63 y.o.   MRN: [id number]       CARDIOLOGY CONSULT NOTE  Patient ID: RASHON WESTRUP MRN: [id number] DOB/AGE: May 29, 1951 63 y.o.  Admit date: (Not on file) Primary Physician Carylon Perches, MD  Reason for Consultation: bradycardia, dyspnea, palpitations.  HPI: The patient is a 63 year old male with a past history of tobacco abuse who has been referred for the evaluation of bradycardia. He had an ECG on 07/07/2014 which demonstrated sinus bradycardia heart rate 45 beats per minute. He underwent pulmonary function testing on July 21 which demonstrated mild airway obstruction. He tells me he had thyroid blood testing done recently which was normal. He also said he had a Life Scan which reportedly demonstrated normal carotid arterial flow as well as lower extremity arterial flow. He denies chest pain. He denies any significant exertional dyspnea, and says that he may experience some mild shortness of breath if he significantly exerts himself when temperatures and humidity are in the 90s. He very seldom experiences "skipped beats" and also seldom experiences dizziness, and denies syncope altogether. He denies a known history of sleep apnea, and denies symptoms such as orthopnea, leg swelling and paroxysmal nocturnal dyspnea. His father lived to be age of 27 and died of leukemia and also had a low resting heart rate. He reportedly did not have any significant coronary artery disease. The patient says he routinely checks his heart rate in the morning it ranges from 55-58 beats per minute. He said it has been as low as 44 beats per minute but he was asymptomatic. He does not routinely exercise but when the weather is cooler and he golfs, he walks all 18 holes.   Allergies  Allergen Reactions  . Penicillins Other (See Comments)    Unknown     No current outpatient prescriptions on file.   No current facility-administered medications for this visit.     No past medical history on file.  Past Surgical History  Procedure Laterality Date  . Tonsilectomy/adenoidectomy with myringotomy    . Knee surgery Left     arthroscopy  . Hernia repair      Dr Dickey Gave  . Hemorrhoid surgery N/A 07/04/2013    Procedure: HEMORRHOIDECTOMY;  Surgeon: Dalia Heading, MD;  Location: [facility];  Service: General;  Laterality: N/A;    History   Social History  . Marital Status: Divorced    Spouse Name: N/A    Number of Children: N/A  . Years of Education: N/A   Occupational History  . Not on file.   Social History Main Topics  . Smoking status: Former Smoker -- 2.00 packs/day for 30 years    Types: Cigarettes    Quit date: 06/30/2002  . Smokeless tobacco: Not on file  . Alcohol Use: No     Comment: clean for 16 years  . Drug Use: No     Comment: recovering addict cocaone-amphetamines-clean for 16 years  . Sexual Activity: Yes    Birth Control/ Protection: None   Other Topics Concern  . Not on file   Social History Narrative  . No narrative on file     No family history of premature CAD in 1st degree relatives.  Prior to Admission medications   Not on File     Review of systems complete and found to be negative unless listed above in HPI     Physical exam Blood pressure 144/82, pulse 62, height 6\' 4"  (1.93  m), weight 212 lb (96.163 kg), SpO2 98.00%. General: NAD Neck: No JVD, no thyromegaly or thyroid nodule.  Lungs: Clear to auscultation bilaterally with normal respiratory effort. CV: Nondisplaced PMI. Regular rate and rhythm, normal S1/S2, no S3/S4, no murmur.  No peripheral edema.  No carotid bruit.  Normal pedal pulses.  Abdomen: Soft, nontender, no hepatosplenomegaly, no distention.  Skin: Intact without lesions or rashes.  Neurologic: Alert and oriented x 3.  Psych: Normal affect. Extremities: No clubbing or cyanosis.  HEENT: Normal.   Labs:   Lab Results  Component Value Date   WBC 9.9 06/30/2013   HGB 14.7  06/30/2013   HCT 42.7 06/30/2013   MCV 87.0 06/30/2013   PLT 261 06/30/2013   No results found for this basename: NA, K, CL, CO2, BUN, CREATININE, CALCIUM, LABALBU, PROT, BILITOT, ALKPHOS, ALT, AST, GLUCOSE,  in the last 168 hours No results found for this basename: CKTOTAL, CKMB, CKMBINDEX, TROPONINI    No results found for this basename: CHOL   No results found for this basename: HDL   No results found for this basename: LDLCALC   No results found for this basename: TRIG   No results found for this basename: CHOLHDL   No results found for this basename: LDLDIRECT         Studies: No results found.  ASSESSMENT AND PLAN:  1. Asymptomatic sinus bradycardia: It appears he has no history of thyroid disease and no documented sleep apnea. He is entirely asymptomatic and his activities of daily living are on inhibited. I do not feel cardiac testing is warranted at this time. I did, however, inform the patient that should he become symptomatic, I would be glad to follow up with him in the future.  2. Dyspnea: Given his prior h/o tobacco abuse and recently documented mild COPD, I do not feel noninvasive cardiac testing is warranted at this time.  3. Palpitations: Seldom and infrequent. No indication for further testing.  4. Elevated BP: He has no h/o essential HTN. This should be monitored in the future to see if antihypertensive therapy is warranted.  Dispo: f/u prn.  Signed: Prentice DockerSuresh Shyquan Stallbaumer, M.D., F.A.C.C.  07/23/2014, 9:10 AM

## 2015-05-10 ENCOUNTER — Ambulatory Visit (INDEPENDENT_AMBULATORY_CARE_PROVIDER_SITE_OTHER): Payer: BLUE CROSS/BLUE SHIELD | Admitting: Orthopedic Surgery

## 2015-05-10 ENCOUNTER — Encounter: Payer: Self-pay | Admitting: Orthopedic Surgery

## 2015-05-10 VITALS — BP 125/69 | Ht 76.0 in | Wt 215.0 lb

## 2015-05-10 DIAGNOSIS — M6530 Trigger finger, unspecified finger: Secondary | ICD-10-CM

## 2015-05-10 NOTE — Progress Notes (Signed)
Patient ID: [name], male   DOB: December 06, 1951, 64 y.o.   MRN: [id number] Left long finger trigger recurrent requests injection  Injected 08/20/2013  Repeat injection left long finger A1 pulley A steroid injection was per performed at the left long finger A1 pulley using 1% lidocaine and 40 mg of Depo-Medrol with ethyl chloride and alcohol skin prep well tolerated no complications

## 2015-11-28 ENCOUNTER — Emergency Department (HOSPITAL_COMMUNITY)
Admission: EM | Admit: 2015-11-28 | Discharge: 2015-11-28 | Disposition: A | Discharge status: Home / Self Care | Payer: BLUE CROSS/BLUE SHIELD | Attending: Emergency Medicine | Admitting: Emergency Medicine

## 2015-11-28 ENCOUNTER — Encounter (HOSPITAL_COMMUNITY): Payer: Self-pay | Admitting: Emergency Medicine

## 2015-11-28 DIAGNOSIS — N390 Urinary tract infection, site not specified: Secondary | ICD-10-CM

## 2015-11-28 DIAGNOSIS — Z79899 Other long term (current) drug therapy: Secondary | ICD-10-CM | POA: Insufficient documentation

## 2015-11-28 DIAGNOSIS — R51 Headache: Secondary | ICD-10-CM | POA: Diagnosis not present

## 2015-11-28 DIAGNOSIS — M542 Cervicalgia: Secondary | ICD-10-CM | POA: Diagnosis not present

## 2015-11-28 DIAGNOSIS — Z880 Allergy status to penicillin: Secondary | ICD-10-CM | POA: Insufficient documentation

## 2015-11-28 DIAGNOSIS — Z87891 Personal history of nicotine dependence: Secondary | ICD-10-CM | POA: Diagnosis not present

## 2015-11-28 DIAGNOSIS — R319 Hematuria, unspecified: Secondary | ICD-10-CM | POA: Diagnosis present

## 2015-11-28 LAB — CBC WITH DIFFERENTIAL/PLATELET
Basophils Absolute: 0.1 10*3/uL (ref 0.0–0.1)
Basophils Relative: 0 %
EOS ABS: 0.2 10*3/uL (ref 0.0–0.7)
EOS PCT: 1 %
HCT: 43.1 % (ref 39.0–52.0)
Hemoglobin: 15.1 g/dL (ref 13.0–17.0)
LYMPHS ABS: 1.5 10*3/uL (ref 0.7–4.0)
Lymphocytes Relative: 7 %
MCH: 30.2 pg (ref 26.0–34.0)
MCHC: 35 g/dL (ref 30.0–36.0)
MCV: 86.2 fL (ref 78.0–100.0)
MONO ABS: 1.9 10*3/uL — AB (ref 0.1–1.0)
MONOS PCT: 9 %
Neutro Abs: 17.2 10*3/uL — ABNORMAL HIGH (ref 1.7–7.7)
Neutrophils Relative %: 83 %
PLATELETS: 210 10*3/uL (ref 150–400)
RBC: 5 MIL/uL (ref 4.22–5.81)
RDW: 12.2 % (ref 11.5–15.5)
WBC: 20.8 10*3/uL — ABNORMAL HIGH (ref 4.0–10.5)

## 2015-11-28 LAB — COMPREHENSIVE METABOLIC PANEL
ALK PHOS: 73 U/L (ref 38–126)
ALT: 21 U/L (ref 17–63)
ANION GAP: 7 (ref 5–15)
AST: 18 U/L (ref 15–41)
Albumin: 4 g/dL (ref 3.5–5.0)
BUN: 14 mg/dL (ref 6–20)
CALCIUM: 9.3 mg/dL (ref 8.9–10.3)
CO2: 23 mmol/L (ref 22–32)
Chloride: 103 mmol/L (ref 101–111)
Creatinine, Ser: 0.92 mg/dL (ref 0.61–1.24)
GFR calc non Af Amer: >60 mL/min (ref >60)
Glucose, Bld: 111 mg/dL — ABNORMAL HIGH (ref 65–99)
Potassium: 3.6 mmol/L (ref 3.5–5.1)
SODIUM: 133 mmol/L — AB (ref 135–145)
Total Bilirubin: 1.6 mg/dL — ABNORMAL HIGH (ref 0.3–1.2)
Total Protein: 7.2 g/dL (ref 6.5–8.1)

## 2015-11-28 LAB — URINALYSIS, ROUTINE W REFLEX MICROSCOPIC
Glucose, UA: NEGATIVE mg/dL
Ketones, ur: 15 mg/dL — AB
Nitrite: POSITIVE — AB
PH: 5.5 (ref 5.0–8.0)
Protein, ur: >300 mg/dL — AB
SPECIFIC GRAVITY, URINE: 1.015 (ref 1.005–1.030)

## 2015-11-28 LAB — URINE MICROSCOPIC-ADD ON

## 2015-11-28 MED ORDER — SULFAMETHOXAZOLE-TRIMETHOPRIM 800-160 MG PO TABS: 1.0000 | ORAL_TABLET | Freq: Two times a day (BID) | ORAL | Status: AC

## 2015-11-28 MED ORDER — SULFAMETHOXAZOLE-TRIMETHOPRIM 800-160 MG PO TABS
1.0000 | ORAL_TABLET | Freq: Once | ORAL | Status: AC
  Administered 2015-11-28: 1 via ORAL
  Filled 2015-11-28: qty 1

## 2015-11-28 NOTE — ED Provider Notes (Signed)
CSN: [id number]     Arrival date & time 11/28/15  1449 History   First MD Initiated Contact with Patient 11/28/15 1509     Chief Complaint  Patient presents with  . Hematuria     (Consider location/radiation/quality/duration/timing/severity/associated sxs/prior Treatment) Patient is a 64 y.o. male presenting with hematuria.  Hematuria This is a new problem. The current episode started yesterday. The problem occurs constantly. Associated symptoms include abdominal pain and headaches. Pertinent negatives include no chest pain and no shortness of breath. Nothing aggravates the symptoms. Nothing relieves the symptoms.    History reviewed. No pertinent past medical history. Past Surgical History  Procedure Laterality Date  . Tonsilectomy/adenoidectomy with myringotomy    . Knee surgery Left     arthroscopy  . Hernia repair      Dr Dickey Gave  . Hemorrhoid surgery N/A 07/04/2013    Procedure: HEMORRHOIDECTOMY;  Surgeon: Dalia Heading, MD;  Location: [facility];  Service: General;  Laterality: N/A;   History reviewed. No pertinent family history. Social History  Substance Use Topics  . Smoking status: Former Smoker -- 2.00 packs/day for 30 years    Types: Cigarettes    Quit date: 06/30/2002  . Smokeless tobacco: Never Used  . Alcohol Use: No     Comment: clean for 16 years    Review of Systems  Constitutional: Positive for chills. Negative for fever.  Eyes: Negative for pain.  Respiratory: Negative for cough and shortness of breath.   Cardiovascular: Negative for chest pain.  Gastrointestinal: Positive for abdominal pain.  Genitourinary: Positive for hematuria.  Musculoskeletal: Positive for back pain and neck pain.  Skin: Negative for rash and wound.  Neurological: Positive for headaches.  All other systems reviewed and are negative.     Allergies  Penicillins  Home Medications   Prior to Admission medications   Medication Sig Start Date End Date Taking? Authorizing  Provider  Amino Acids (AMINO ACID PO) Take 1 packet by mouth every other day.    Yes Historical Provider, MD  sulfamethoxazole-trimethoprim (BACTRIM DS,SEPTRA DS) 800-160 MG tablet Take 1 tablet by mouth 2 (two) times daily. 11/29/15 12/09/15  Marily Memos, MD   BP 126/64 mmHg  Pulse 68  Temp(Src) 98.5 F (36.9 C) (Oral)  Resp 16  Ht  (1.93 m)  Wt 208 lb (94.348 kg)  BMI 25.33 kg/m2  SpO2 100% Physical Exam  Constitutional: He is oriented to person, place, and time. He appears well-developed and well-nourished.  HENT:  Head: Normocephalic and atraumatic.  Neck: Normal range of motion.  Cardiovascular: Normal rate.   Pulmonary/Chest: Effort normal. No respiratory distress. He has no wheezes.  Abdominal: Soft. He exhibits no distension.  Musculoskeletal: Normal range of motion. He exhibits no edema or tenderness.  Neurological: He is alert and oriented to person, place, and time. No cranial nerve deficit. Coordination normal.  Nursing note and vitals reviewed.   ED Course  Procedures (including critical care time) Labs Review Labs Reviewed  URINALYSIS, ROUTINE W REFLEX MICROSCOPIC (NOT AT [facility]) - Abnormal; Notable for the following:    Color, Urine RED (*)    APPearance CLOUDY (*)    Hgb urine dipstick LARGE (*)    Bilirubin Urine MODERATE (*)    Ketones, ur 15 (*)    Protein, ur >300 (*)    Nitrite POSITIVE (*)    Leukocytes, UA LARGE (*)    All other components within normal limits  CBC WITH DIFFERENTIAL/PLATELET - Abnormal; Notable for the  following:    WBC 20.8 (*)    Neutro Abs 17.2 (*)    Monocytes Absolute 1.9 (*)    All other components within normal limits  COMPREHENSIVE METABOLIC PANEL - Abnormal; Notable for the following:    Sodium 133 (*)    Glucose, Bld 111 (*)    Total Bilirubin 1.6 (*)    All other components within normal limits  URINE MICROSCOPIC-ADD ON - Abnormal; Notable for the following:    Squamous Epithelial / LPF 0-5 (*)    Bacteria, UA  MANY (*)    All other components within normal limits  URINE CULTURE    Imaging Review No results found. I have personally reviewed and evaluated these images and lab results as part of my medical decision-making.   EKG Interpretation None      MDM   Final diagnoses:  UTI (lower urinary tract infection)   Likely UTI. Will check labs. Doubt prostatitis, CA, but will need FU if urine negative.   UTI on labs. Leukocytosis as well. Will tx w/ bactrim as he has PCN allergy.   Will follow up with PCP for repeat UA.      Marily MemosJason Nasya Vincent, MD 12/01/15 [phone]

## 2015-11-28 NOTE — ED Notes (Signed)
Patient c/o pain with urination in which he noticed in blood in urine. Patient reports burning sensation with urgency to urinate but only voiding small amounts at a time. Per patient chills with fever and nausea. Patient reports urinating after checking into ER and noting 3 "small particulates" in toilet after voiding.

## 2015-12-01 LAB — URINE CULTURE: Culture: >=100000

## 2015-12-02 ENCOUNTER — Telehealth (HOSPITAL_BASED_OUTPATIENT_CLINIC_OR_DEPARTMENT_OTHER): Payer: Self-pay | Admitting: Emergency Medicine

## 2015-12-02 ENCOUNTER — Telehealth: Payer: Self-pay | Admitting: Emergency Medicine

## 2015-12-02 NOTE — Telephone Encounter (Signed)
Post ED Visit - Positive Culture Follow-up  Culture report reviewed by antimicrobial stewardship pharmacist:  [x]  [name], Pharm.D. []  [name], Pharm.D., BCPS []  [name], Pharm.D. []  [name], [address] BCPS []  [place] Liu, [address]Ph[address] BCPS, AAHIVP []  [name], Pharm.D., BCPS, AAHIVP []  [name], Pharm.D. []  [name], [address]  Positive urine culture E. coli Treated with bactrim DS, organism sensitive to the same and no further patient follow-up is required at this time.  [name], [name] 12/02/2015, 10:01 AM

## 2017-03-20 ENCOUNTER — Encounter: Payer: Self-pay | Admitting: Orthopedic Surgery

## 2017-03-20 ENCOUNTER — Ambulatory Visit (INDEPENDENT_AMBULATORY_CARE_PROVIDER_SITE_OTHER): Payer: BLUE CROSS/BLUE SHIELD | Admitting: Orthopedic Surgery

## 2017-03-20 ENCOUNTER — Ambulatory Visit (INDEPENDENT_AMBULATORY_CARE_PROVIDER_SITE_OTHER): Payer: BLUE CROSS/BLUE SHIELD

## 2017-03-20 VITALS — BP 127/80 | HR 64 | Ht 75.0 in | Wt 211.0 lb

## 2017-03-20 DIAGNOSIS — M25562 Pain in left knee: Secondary | ICD-10-CM | POA: Diagnosis not present

## 2017-03-20 DIAGNOSIS — S83262A Peripheral tear of lateral meniscus, current injury, left knee, initial encounter: Secondary | ICD-10-CM

## 2017-03-20 DIAGNOSIS — M2342 Loose body in knee, left knee: Secondary | ICD-10-CM | POA: Diagnosis not present

## 2017-03-20 NOTE — Progress Notes (Signed)
New problem. 

## 2017-03-20 NOTE — Progress Notes (Signed)
New problem  Patient ID: [name], male   DOB: Aug 31, 1951, 66 y.o.   MRN: [id number]   Chief complaint is clicking left knee   [name] is a 66 y.o. male.   HPI 66 year old male had a left knee arthroscopy several years ago did well up until about a year ago when he started having a clicking sensation when flexing and extending his left knee with painful lateral joint line. He did not notice any swelling.  It seemed to be worse when he crossed his left leg over his right  Occasionally the knee will catch but there's been no locking or giving way  Review of Systems Review of Systems  Musculoskeletal: Positive for joint pain. Negative for falls.  Neurological: Negative for tingling.    No past medical history on file.  Past Surgical History:  Procedure Laterality Date  . HEMORRHOID SURGERY N/A 07/04/2013   Procedure: HEMORRHOIDECTOMY;  Surgeon: Dalia Heading, MD;  Location: [facility];  Service: General;  Laterality: N/A;  . HERNIA REPAIR     Dr Dickey Gave  . KNEE SURGERY Left    arthroscopy  . TONSILECTOMY/ADENOIDECTOMY WITH MYRINGOTOMY      Allergies  Allergen Reactions  . Penicillins Other (See Comments)    Has patient had a PCN reaction causing immediate rash, facial/tongue/throat swelling, SOB or lightheadedness with hypotension: unknown Has patient had a PCN reaction causing severe rash involving mucus membranes or skin necrosis:unknown Has patient had a PCN reaction that required hospitalization unknown Has patient had a PCN reaction occurring within the last 10 years:unknown If all of the above answers are "NO", then may proceed with Cephalosporin use.     No current outpatient prescriptions on file.   No current facility-administered medications for this visit.      Physical Exam BP 127/80   Pulse 64   Ht  (1.905 m)   Wt 211 lb (95.7 kg)   BMI 26.37 kg/m  Physical Exam   The patient is well developed well nourished and well groomed.    Orientation to person place and time is normal   Mood is pleasant. Affect normal  Ambulatory status is remarkable for NO limp in the involved extremity         LEFT Knee examination:  Inspection: Tenderness is noted over the medial joint line   ROM: Is limited by pain with a maximum flexion arc of 130  Stability: Collateral ligaments are stable, the Lachman test and anterior and posterior drawer tests are normal  We do palpate LATERAL  joint line tenderness and a positive McMurray's for LATERAL meniscal tear   Motor exam: Grade 5 motor strength in the quadriceps musculature   Skin: Warm dry and intact over the right leg                       Neuro: normal sensation   Vascular: 2+ DP pulse with normal color and no edema.   Currently the RIGHT  lower extremity and knee examination revealed no tenderness or swelling, full range of motion without contracture subluxation atrophy or tremor. Normal muscle tone no instability and the neurovascular status of the limb is normal.    Assessment and Plan:  IMAGING: I have read and interpret the xrays as follows: Symmetric medial and lateral joint space narrowing with possible chondrocalcinosis throughout the lateral meniscus   Diagnosis and treatment:   Osteoarthritis of the knee  Torn lateral meniscus  Encounter Diagnoses  Name Primary?  . Left knee pain, unspecified chronicity Yes  . Loose body in knee, left knee   . Peripheral tear of lateral meniscus of left knee as current injury, initial encounter     MRI for possible loose body versus lateral meniscal tear  Fuller Canada, MD 03/20/2017 12:12 PM

## 2017-03-22 ENCOUNTER — Ambulatory Visit (HOSPITAL_COMMUNITY)
Admission: RE | Admit: 2017-03-22 | Discharge: 2017-03-22 | Disposition: A | Discharge status: Home / Self Care | Payer: BLUE CROSS/BLUE SHIELD | Source: Ambulatory Visit | Attending: Orthopedic Surgery | Admitting: Orthopedic Surgery

## 2017-03-22 DIAGNOSIS — M2342 Loose body in knee, left knee: Secondary | ICD-10-CM

## 2017-03-22 DIAGNOSIS — M948X6 Other specified disorders of cartilage, lower leg: Secondary | ICD-10-CM | POA: Diagnosis not present

## 2017-03-22 DIAGNOSIS — X58XXXA Exposure to other specified factors, initial encounter: Secondary | ICD-10-CM | POA: Insufficient documentation

## 2017-03-22 DIAGNOSIS — S83282A Other tear of lateral meniscus, current injury, left knee, initial encounter: Secondary | ICD-10-CM | POA: Insufficient documentation

## 2017-03-30 ENCOUNTER — Ambulatory Visit (INDEPENDENT_AMBULATORY_CARE_PROVIDER_SITE_OTHER): Payer: BLUE CROSS/BLUE SHIELD | Admitting: Orthopedic Surgery

## 2017-03-30 DIAGNOSIS — M2342 Loose body in knee, left knee: Secondary | ICD-10-CM

## 2017-03-30 DIAGNOSIS — M25562 Pain in left knee: Secondary | ICD-10-CM

## 2017-03-30 DIAGNOSIS — S83262A Peripheral tear of lateral meniscus, current injury, left knee, initial encounter: Secondary | ICD-10-CM

## 2017-03-30 NOTE — Progress Notes (Signed)
MRI follow-up left knee  Prior left knee pain  Review of systems no swelling in the knee at this time actually only having minimal discomfort  MRI is reviewed with this report my interpretation is that he has arthritis of the knee has a torn lateral meniscus and he also has a previously torn medial meniscus with some fraying  IMPRESSION: 1. Tricompartmental cartilage abnormalities as described above. 2. Horizontal tear of the anterior horn and body of the lateral meniscus extending to the free edge. 3. Severe attenuation of the posterior horn and body of the medial meniscus which may reflect severe tear versus prior meniscectomy.   Electronically Signed   By: Elige Ko   On: 03/22/2017 10:29  Discussed this with him right now he is doing okay learn to live with it he actually played golf yesterday  So is confident up with his surgery on hold for now and follow-up with [place] on an as-needed basis

## 2017-07-23 ENCOUNTER — Ambulatory Visit: Payer: BLUE CROSS/BLUE SHIELD | Admitting: Orthopedic Surgery

## 2018-04-21 IMAGING — MR MR KNEE*L* W/O CM
4 of 6 series · 13 of 40 positions shown · non-contrast
Comparison: 10/30/2012

CLINICAL DATA: Left knee pain for 30 years

EXAM:
MRI OF THE LEFT KNEE WITHOUT CONTRAST
TECHNIQUE: Multiplanar, multisequence MR imaging of the knee was performed. No
intravenous contrast was administered.

[Series 3: pdfs axial · axial · 3.0mm · 0.19mm/px · z∈[-60,+12]mm · 3 of 30 slices shown]
[im 5/30]
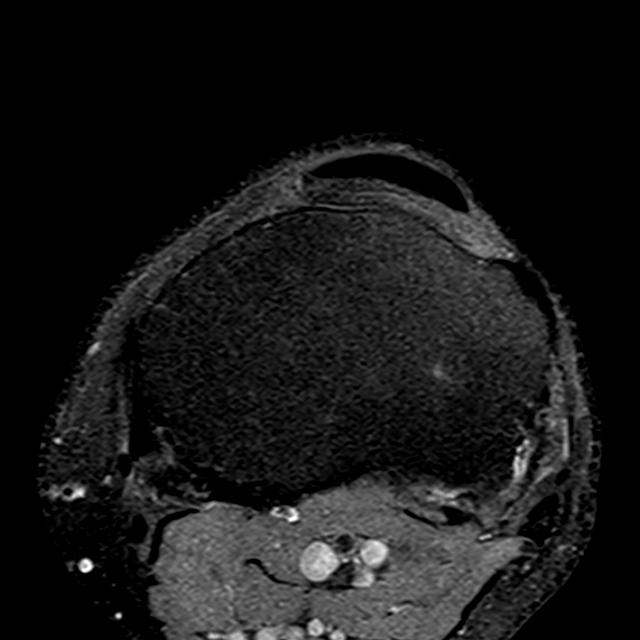
[im 15/30]
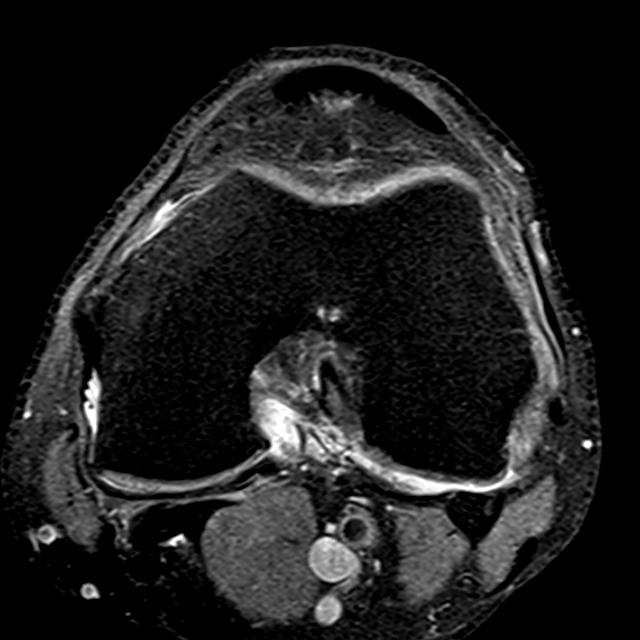
[im 25/30]
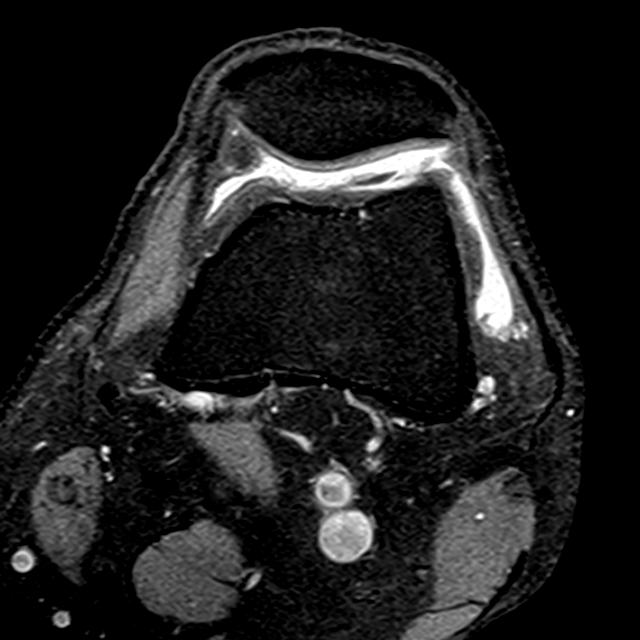

[Series 4: T1 · coronal · 3.0mm · 0.22mm/px · 4 of 26 slices shown]
[im 1/26]
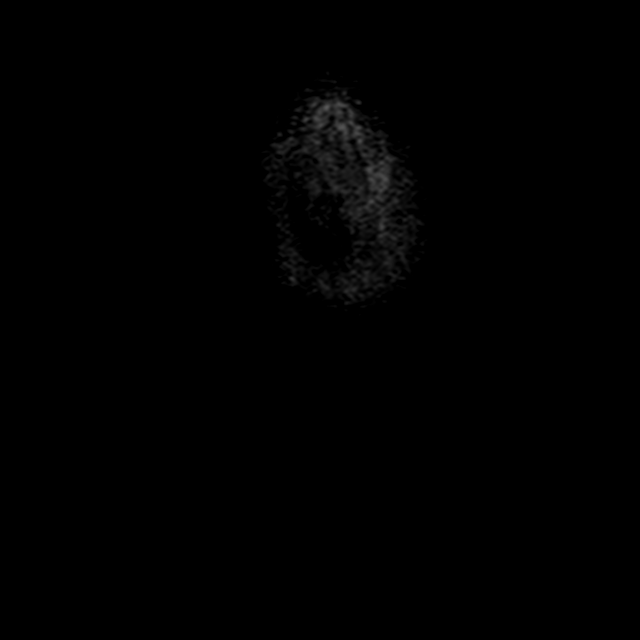
[im 5/26]
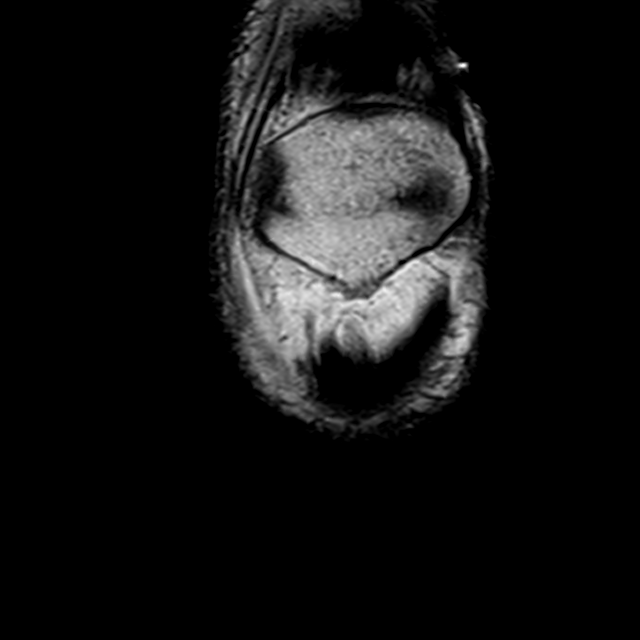
[im 13/26]
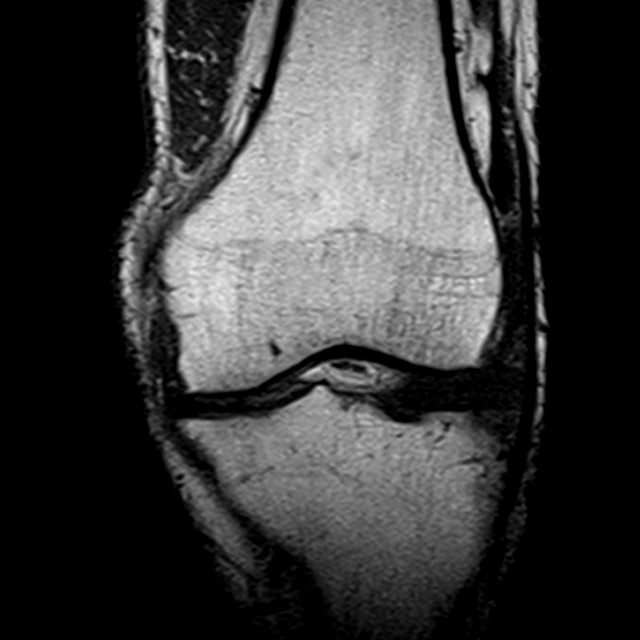
[im 21/26]
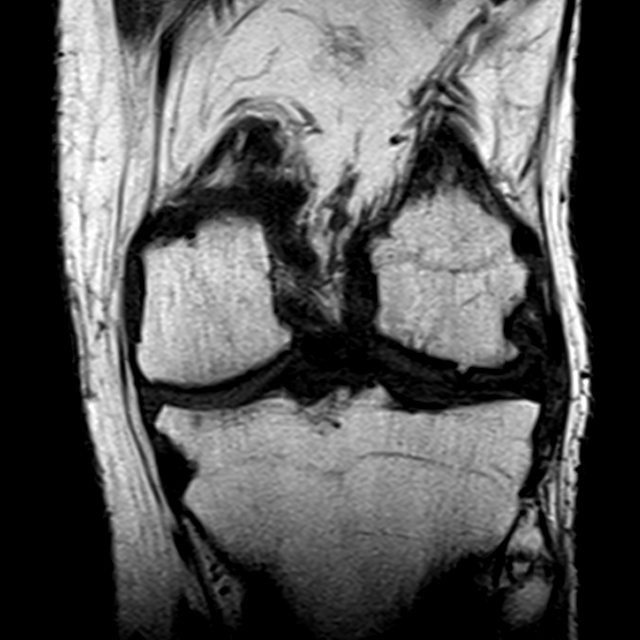

[Series 5: pdfs sag · sagittal · 3.0mm · 0.22mm/px · 3 of 29 slices shown]
[im 5/29]
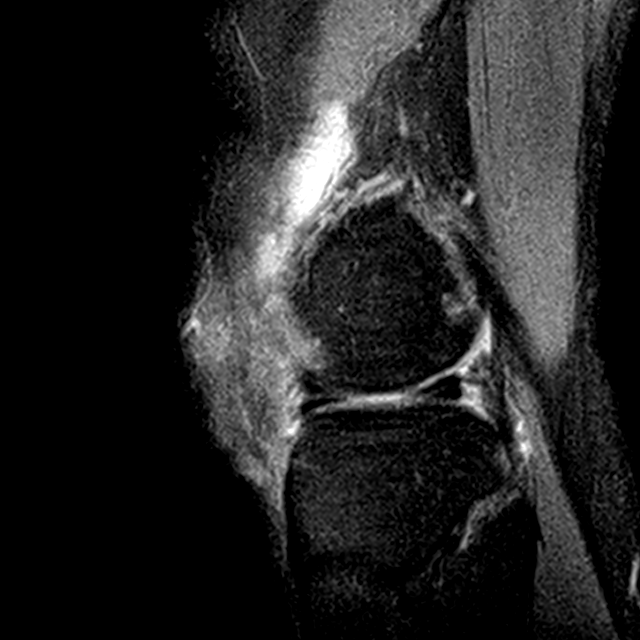
[im 17/29]
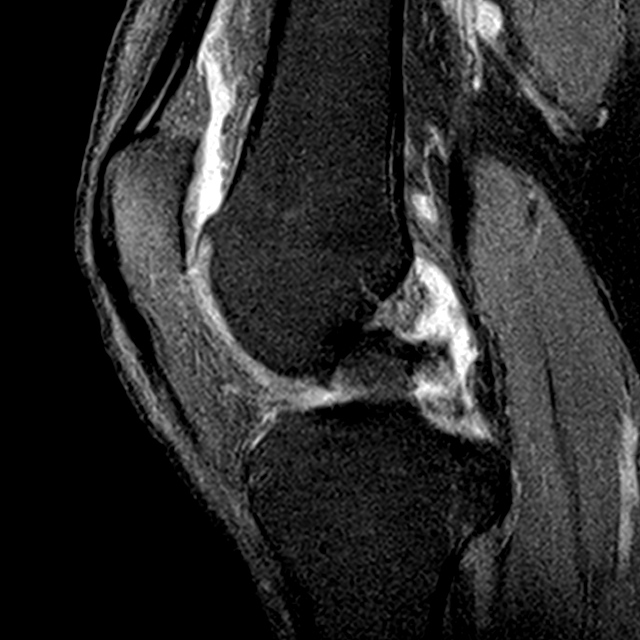
[im 25/29]
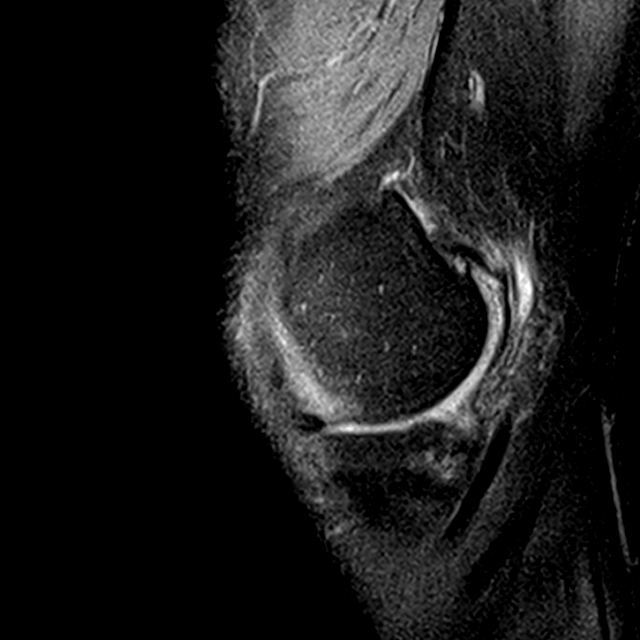

[Series 6: t2fs cor · coronal · 3.0mm · 0.22mm/px · 3 of 26 slices shown]
[im 5/26]
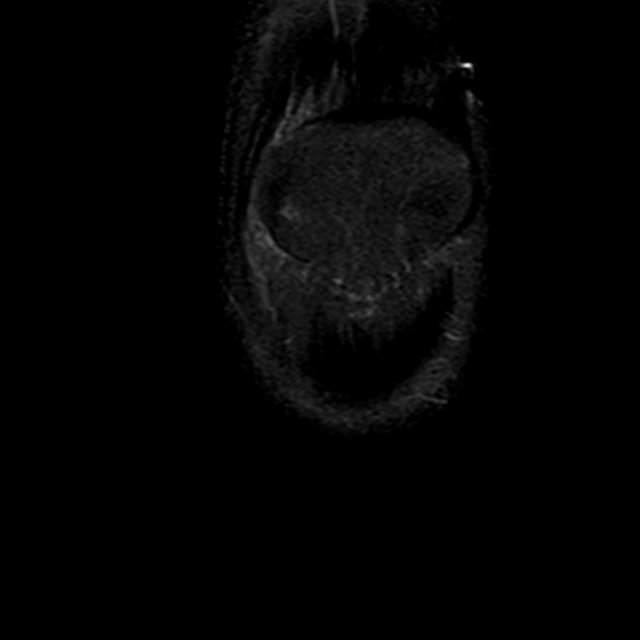
[im 13/26]
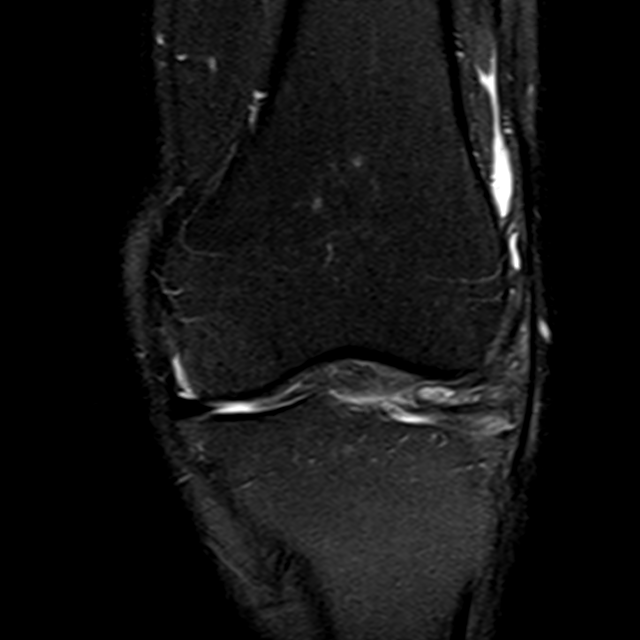
[im 21/26]
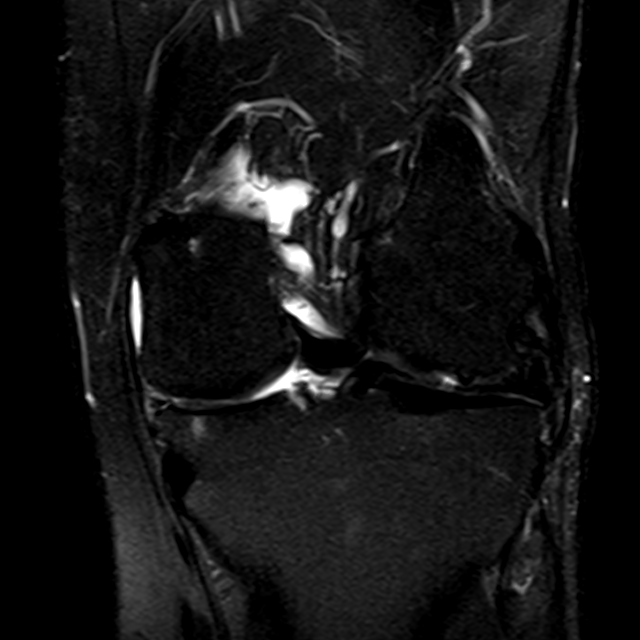

[13 of 40 positions shown; findings below may reference images not displayed]

FINDINGS: MENISCI

Medial meniscus: Severe attenuation of the posterior horn and body
of the medial meniscus which may reflect severe tear versus prior
meniscectomy.

Lateral meniscus: Horizontal tear of the anterior horn and body of
the lateral meniscus extending to the free edge.

LIGAMENTS

Cruciates:  Intact ACL and PCL.

Collaterals: Medial collateral ligament is intact. Lateral
collateral ligament complex is intact.

CARTILAGE

Patellofemoral: Mild partial-thickness cartilage loss of the
periphery of the medial patellar facet. Mild partial-thickness
cartilage loss of the trochlear groove.

Medial: High-grade partial-thickness cartilage loss with areas of
full-thickness cartilage loss of the medial femoral condyle and
medial tibial plateau.

Lateral: High-grade partial-thickness cartilage loss with areas of
full-thickness cartilage loss of the lateral femoral condyle. Mild
partial-thickness cartilage loss of the lateral tibial plateau.

Joint: Moderate joint effusion. Mild edema in Hoffa's fat. No plical
thickening.

Popliteal Fossa:  Tiny Baker cyst.  Intact popliteus tendon.

Extensor Mechanism: Intact quadriceps tendon and patellar tendon.
Intact medial and lateral patellar retinaculum. Intact MPFL.

Bones:  No marrow signal abnormality.  No fracture or dislocation.

Other: No fluid collection or hematoma.
IMPRESSION: 1. Tricompartmental cartilage abnormalities as described above.
2. Horizontal tear of the anterior horn and body of the lateral
meniscus extending to the free edge.
3. Severe attenuation of the posterior horn and body of the medial
meniscus which may reflect severe tear versus prior meniscectomy.

## 2018-11-27 ENCOUNTER — Ambulatory Visit (INDEPENDENT_AMBULATORY_CARE_PROVIDER_SITE_OTHER): Payer: Medicare HMO

## 2018-11-27 ENCOUNTER — Ambulatory Visit: Payer: Medicare HMO | Admitting: Orthopedic Surgery

## 2018-11-27 ENCOUNTER — Encounter: Payer: Self-pay | Admitting: Orthopedic Surgery

## 2018-11-27 VITALS — BP 150/81 | HR 55 | Ht 75.0 in | Wt 240.0 lb

## 2018-11-27 DIAGNOSIS — M19021 Primary osteoarthritis, right elbow: Secondary | ICD-10-CM

## 2018-11-27 DIAGNOSIS — M25421 Effusion, right elbow: Secondary | ICD-10-CM

## 2018-11-27 DIAGNOSIS — M7021 Olecranon bursitis, right elbow: Secondary | ICD-10-CM

## 2018-11-27 NOTE — Patient Instructions (Signed)
Elbow Bursitis A bursa is a fluid-filled sac that covers and protects a joint. Bursitis is when the fluid-filled sac gets puffy and sore (inflamed). Elbow bursitis, also called olecranon bursitis, happens over your elbow. This may be caused by:  Injury (acute trauma) to your elbow.  Leaning on hard surfaces for long periods of time.  Infection from an injury that breaks the skin near your elbow.  A bone growth (spur) that forms at the tip of your elbow.  A medical condition that causes inflammation in your body, such as: ? Gout. ? Rheumatoid arthritis.  Sometimes the cause is not known. Follow these instructions at home:  Take medicines only as told by your doctor.  If you were prescribed an antibiotic medicine, finish all of it even if you start to feel better.  If your bursitis is caused by an injury, rest your elbow and wear your bandage as told by your doctor. You may also apply ice to the injured area as told by your doctor: ? Put ice in a plastic bag. ? Place a towel between your skin and the bag. ? Leave the ice on for 20 minutes, 2-3 times per day.  Do not do any activities that cause pain to your elbow.  Use elbow pads or wraps to cushion your elbow. Contact a doctor if:  You have a fever.  Your symptoms do not get better with treatment.  Your pain or swelling gets worse.  Your pain or swelling goes away and then comes back.  You have drainage of pus from the swollen area over your elbow. This information is not intended to replace advice given to you by your health care provider. Make sure you discuss any questions you have with your health care provider. Document Released: 05/24/2010 Document Revised: 05/11/2016 Document Reviewed: 08/12/2014 Elsevier Interactive Patient Education  2018 [employer]  

## 2018-11-27 NOTE — Progress Notes (Signed)
NEW PROBLEM OFFICE VISIT  Chief Complaint  Patient presents with  . Elbow Problem    Rt elbow swelling.    67 year old male avid golfer presents for evaluation of pain over his right elbow with swelling  Patient noticed a swollen right olecranon bursa he treated with ice and heat the swelling went down but afterwards he noted what felt like something moving in the soft tissues beneath the elbow and he came in for evaluation.  Location right elbow duration about 3 months quality occasional sharp discomfort mild in severity associated with the soft tissue swelling but no loss of motion   Review of Systems  Constitutional: Negative for chills and fever.  Musculoskeletal: Positive for joint pain.  Neurological: Negative for tingling, sensory change and focal weakness.     History reviewed. No pertinent past medical history.  Past Surgical History:  Procedure Laterality Date  . HEMORRHOID SURGERY N/A 07/04/2013   Procedure: HEMORRHOIDECTOMY;  Surgeon: Dalia Heading, MD;  Location: [facility];  Service: General;  Laterality: N/A;  . HERNIA REPAIR     Dr Dickey Gave  . KNEE SURGERY Left    arthroscopy  . TONSILECTOMY/ADENOIDECTOMY WITH MYRINGOTOMY      Family History  Problem Relation Age of Onset  . Cancer Mother   . Pulmonary disease Mother   . Leukemia Father    Social History   Tobacco Use  . Smoking status: Former Smoker    Packs/day: 2.00    Years: 30.00    Pack years: 60.00    Types: Cigarettes    Last attempt to quit: 06/30/2002    Years since quitting: 16.4  . Smokeless tobacco: Never Used  Substance Use Topics  . Alcohol use: No    Comment: clean for 16 years  . Drug use: No    Comment: recovering addict cocaone-amphetamines-clean for 16 years    Allergies  Allergen Reactions  . Penicillins Other (See Comments)    Has patient had a PCN reaction causing immediate rash, facial/tongue/throat swelling, SOB or lightheadedness with hypotension: unknown Has patient  had a PCN reaction causing severe rash involving mucus membranes or skin necrosis:unknown Has patient had a PCN reaction that required hospitalization unknown Has patient had a PCN reaction occurring within the last 10 years:unknown If all of the above answers are "NO", then may proceed with Cephalosporin use.     No outpatient medications have been marked as taking for the 11/27/18 encounter (Office Visit) with Vickki Hearing, MD.    BP (!) 150/81   Pulse (!) 55   Ht 6\' 3"  (1.905 m)   Wt 240 lb (108.9 kg)   BMI 30.00 kg/m   Physical Exam  Constitutional: He is oriented to person, place, and time. He appears well-developed and well-nourished.  Neurological: He is alert and oriented to person, place, and time.  Psychiatric: He has a normal mood and affect. His behavior is normal. Judgment and thought content normal.    Ortho Exam  Right elbow bursal swelling no tenderness slight loss of extension full range of motion full flexion extension rotation.  No instability is detected strength is normal in flexion extension skin looks great neurovascular exam is normal  Left elbow No tenderness full range of motion no instability normal strength neurovascular exam is intact skin normal no bursal swelling  MEDICAL DECISION SECTION  Xrays were done at [facility] orthopedics  My independent reading of xrays:  See report arthritis in the radiocapitellar joint with soft tissue swelling consistent  with exam showing bursitis of the right elbow  Encounter Diagnoses  Name Primary?  . Elbow swelling, right Yes  . Olecranon bursitis of right elbow   . Arthritis of right elbow     PLAN: (Rx., injectx, surgery, frx, mri/ct) Recommend he continue with ice and heat active range of motion exercises normal activities follow-up if the swelling comes back and he can get it to go down with ice or heat.  We gave him options of aspiration several times if needed and then surgical treatment if that  does not control the situation  No orders of the defined types were placed in this encounter.   Fuller CanadaStanley , MD  11/27/2018 11:07 AM

## 2018-12-23 DIAGNOSIS — R7301 Impaired fasting glucose: Secondary | ICD-10-CM | POA: Insufficient documentation

## 2018-12-23 DIAGNOSIS — M1A09X0 Idiopathic chronic gout, multiple sites, without tophus (tophi): Secondary | ICD-10-CM | POA: Insufficient documentation

## 2018-12-23 DIAGNOSIS — E7849 Other hyperlipidemia: Secondary | ICD-10-CM | POA: Insufficient documentation

## 2019-10-06 ENCOUNTER — Encounter (INDEPENDENT_AMBULATORY_CARE_PROVIDER_SITE_OTHER): Payer: Self-pay | Admitting: Internal Medicine

## 2019-10-06 ENCOUNTER — Other Ambulatory Visit: Payer: Self-pay

## 2019-10-06 ENCOUNTER — Ambulatory Visit (INDEPENDENT_AMBULATORY_CARE_PROVIDER_SITE_OTHER): Payer: Medicare HMO | Admitting: Internal Medicine

## 2019-10-06 VITALS — BP 136/80 | HR 54 | Temp 99.4°F | Resp 18 | Ht 75.0 in | Wt 210.0 lb

## 2019-10-06 DIAGNOSIS — E559 Vitamin D deficiency, unspecified: Secondary | ICD-10-CM | POA: Diagnosis not present

## 2019-10-06 DIAGNOSIS — M109 Gout, unspecified: Secondary | ICD-10-CM

## 2019-10-06 DIAGNOSIS — M1A0790 Idiopathic chronic gout, unspecified ankle and foot, without tophus (tophi): Secondary | ICD-10-CM | POA: Diagnosis not present

## 2019-10-06 HISTORY — DX: Gout, unspecified: M10.9

## 2019-10-06 NOTE — Progress Notes (Signed)
   Chief Complaint: This 68 year old man comes to our practice to be established. HPI: He has a history of gout and apparently sees a rheumatologist.  He takes allopurinol and colchicine which seems to keep him very stable. He does not seem to have any other major concerns today.  Past Medical History:  Diagnosis Date  . Gout 10/06/2019   Past Surgical History:  Procedure Laterality Date  . HEMORRHOID SURGERY N/A 07/04/2013   Procedure: HEMORRHOIDECTOMY;  Surgeon: Jamesetta So, MD;  Location: [facility];  Service: General;  Laterality: N/A;  . HERNIA REPAIR     Dr Leda Roys  . KNEE SURGERY Left    arthroscopy  . 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        Allergies:  Allergies  Allergen Reactions  . Penicillins Other (See Comments)    Has patient had a PCN reaction causing immediate rash, facial/tongue/throat swelling, SOB or lightheadedness with hypotension: unknown Has patient had a PCN reaction causing severe rash involving mucus membranes or skin necrosis:unknown Has patient had a PCN reaction that required hospitalization unknown Has patient had a PCN reaction occurring within the last 10 years:unknown If all of the above answers are "NO", then may proceed with Cephalosporin use.      Current Meds  Medication Sig  . allopurinol (ZYLOPRIM) 300 MG tablet Take 1 tablet by mouth daily.  . Colchicine 0.6 MG CAPS Take 1 capsule by mouth daily.  . finasteride (PROPECIA) 1 MG tablet Take 1 tablet by mouth daily.     Nutrition He usually has 1 large meal a day and snacks in between.    Exercise He usually rides a bicycle on average 10 miles a day.  Bio-identical hormones None.  GGE:ZMOQH from the symptoms mentioned above,there are no other symptoms referable to all systems reviewed.  Physical  Exam: Blood pressure 136/80, pulse (!) 54, temperature 99.4 F (37.4 C), temperature source Temporal, resp. rate 18, height 6\' 3"  (1.905 m), weight 210 lb (95.3 kg), SpO2 96 %. Vitals with BMI 10/06/2019 11/27/2018 03/20/2017  Height 6\' 3"  6\' 3"  6\' 3"   Weight 210 lbs 240 lbs 211 lbs  BMI 47.65 30 46.5  Systolic 035 465 681  Diastolic 80 81 80  Pulse 54 55 64      He looks systemically well.  He is alert and orientated.  Blood pressure is reasonable for his age.  No obvious neurological signs.    Assessment  1. Vitamin D deficiency disease   2. Chronic gout of foot, unspecified cause, unspecified laterality     Tests Ordered:   Orders Placed This Encounter  Procedures  . COMPLETE METABOLIC PANEL WITH GFR  . VITAMIN D 25 Hydroxy (Vit-D Deficiency, Fractures)     Plan  1. He will continue on medications for his gout and we will check blood work as ordered above. 2. I will see him in 3 to 4 months time for an annual physical exam.     No orders of the defined types were placed in this encounter.    Syliva Mee C Sueo Cullen   10/06/2019, 2:58 PM

## 2019-10-06 NOTE — Patient Instructions (Signed)
[facility] Dietary Recommendations for Weight Loss What to Avoid . Avoid added sugars o Often added sugar can be found in processed foods such as many condiments, dry cereals, cakes, cookies, chips, crisps, crackers, candies, sweetened drinks, etc.  o Read labels and AVOID/DECREASE use of foods with the following in their ingredient list: Sugar, fructose, high fructose corn syrup, sucrose, glucose, maltose, dextrose, molasses, cane sugar, brown sugar, any type of syrup, agave nectar, etc.   . Avoid snacking in between meals . Avoid foods made with flour o If you are going to eat food made with flour, choose those made with whole-grains; and, minimize your consumption as much as is tolerable . Avoid processed foods o These foods are generally stocked in the middle of the grocery store. Focus on shopping on the perimeter of the grocery.  What to Include . Vegetables o GREEN LEAFY VEGETABLES: Kale, spinach, mustard greens, collard greens, cabbage, broccoli, etc. o OTHER: Asparagus, cauliflower, eggplant, carrots, peas, Brussel sprouts, tomatoes, bell peppers, zucchini, beets, cucumbers, etc. . Grains, seeds, and legumes o Beans: kidney beans, black eyed peas, garbanzo beans, black beans, pinto beans, etc. o Whole, unrefined grains: brown rice, barley, bulgur, oatmeal, etc. . Healthy fats  o Avoid highly processed fats such as vegetable oil o Examples of healthy fats: avocado, olives, virgin olive oil, dark chocolate (?72% Cocoa), nuts (peanuts, almonds, walnuts, cashews, pecans, etc.) . Low - Moderate Intake of Animal Sources of Protein o Meat sources: chicken, turkey, salmon, tuna. Limit to 4 ounces of meat at one time. o Consider limiting dairy sources, but when choosing dairy focus on: PLAIN Greek yogurt, cottage cheese, high-protein milk . Fruit o Choose berries  When to Eat . Intermittent Fasting: o Choosing not to eat for a specific time period, but DO FOCUS ON HYDRATION  when fasting o Multiple Techniques: - Time Restricted Eating: eat 3 meals in a day, each meal lasting no more than 60 minutes, no snacks between meals - 16-18 hour fast: fast for 16 to 18 hours up to 7 days a week. Often suggested to start with 2-3 nonconsecutive days per week.  . Remember the time you sleep is counted as fasting.  . Examples of eating schedule: Fast from 7:00pm-11:00am. Eat between 11:00am-7:00pm.  - 24-hour fast: fast for 24 hours up to every other day. Often suggested to start with 1 day per week . Remember the time you sleep is counted as fasting . Examples of eating schedule:  o Eating day: eat 2-3 meals on your eating day. If doing 2 meals, each meal should last no more than 90 minutes. If doing 3 meals, each meal should last no more than 60 minutes. Finish last meal by 7:00pm. o Fasting day: Fast until 7:00pm.  o IF YOU FEEL UNWELL FOR ANY REASON/IN ANY WAY WHEN FASTING, STOP FASTING BY EATING A NUTRITIOUS SNACK OR LIGHT MEAL o ALWAYS FOCUS ON HYDRATION DURING FASTS - Acceptable Hydration sources: water, broths, tea/coffee (black tea/coffee is best but using a small amount of whole-fat dairy products in coffee/tea is acceptable).  - Poor Hydration Sources: anything with sugar or artificial sweeteners added to it  These recommendations have been developed for patients that are actively receiving medical care from either Dr. Jenavive Lamboy or Sarah Gray, DNP, NP-C at [facility]. These recommendations are developed for patients with specific medical conditions and are not meant to be distributed or used by others that are not actively receiving care from either provider listed   above at [facility]. It is not appropriate to participate in the above eating plans without proper medical supervision.   Reference: Fung, J. The obesity code. [place]: [place]; 2016.   

## 2019-10-07 LAB — COMPLETE METABOLIC PANEL WITH GFR
AG Ratio: 1.8 (calc) (ref 1.0–2.5)
ALT: 20 U/L (ref 9–46)
AST: 19 U/L (ref 10–35)
Albumin: 4.2 g/dL (ref 3.6–5.1)
Alkaline phosphatase (APISO): 55 U/L (ref 35–144)
BUN/Creatinine Ratio: 12 (calc) (ref 6–22)
BUN: 16 mg/dL (ref 7–25)
CO2: 26 mmol/L (ref 20–32)
Calcium: 10.2 mg/dL (ref 8.6–10.3)
Chloride: 107 mmol/L (ref 98–110)
Creat: 1.38 mg/dL — ABNORMAL HIGH (ref 0.70–1.25)
GFR, Est African American: 61 mL/min/{1.73_m2} (ref >=60)
GFR, Est Non African American: 53 mL/min/{1.73_m2} — ABNORMAL LOW (ref >=60)
Globulin: 2.3 g/dL (calc) (ref 1.9–3.7)
Glucose, Bld: 103 mg/dL — ABNORMAL HIGH (ref 65–99)
Potassium: 4.5 mmol/L (ref 3.5–5.3)
Sodium: 143 mmol/L (ref 135–146)
Total Bilirubin: 0.6 mg/dL (ref 0.2–1.2)
Total Protein: 6.5 g/dL (ref 6.1–8.1)

## 2019-10-07 LAB — VITAMIN D 25 HYDROXY (VIT D DEFICIENCY, FRACTURES): Vit D, 25-Hydroxy: 30 ng/mL (ref 30–100)

## 2020-02-03 ENCOUNTER — Encounter (INDEPENDENT_AMBULATORY_CARE_PROVIDER_SITE_OTHER): Payer: Medicare HMO | Admitting: Internal Medicine

## 2020-03-10 ENCOUNTER — Ambulatory Visit (INDEPENDENT_AMBULATORY_CARE_PROVIDER_SITE_OTHER): Payer: Medicare HMO | Admitting: Internal Medicine

## 2020-03-10 ENCOUNTER — Other Ambulatory Visit: Payer: Self-pay

## 2020-03-10 ENCOUNTER — Encounter (INDEPENDENT_AMBULATORY_CARE_PROVIDER_SITE_OTHER): Payer: Self-pay | Admitting: Internal Medicine

## 2020-03-10 VITALS — BP 130/80 | HR 50 | Temp 97.6°F | Ht 75.0 in | Wt 214.0 lb

## 2020-03-10 DIAGNOSIS — Z131 Encounter for screening for diabetes mellitus: Secondary | ICD-10-CM

## 2020-03-10 DIAGNOSIS — Z13220 Encounter for screening for lipoid disorders: Secondary | ICD-10-CM

## 2020-03-10 DIAGNOSIS — R5383 Other fatigue: Secondary | ICD-10-CM

## 2020-03-10 DIAGNOSIS — Z0001 Encounter for general adult medical examination with abnormal findings: Secondary | ICD-10-CM | POA: Diagnosis not present

## 2020-03-10 DIAGNOSIS — M1A0790 Idiopathic chronic gout, unspecified ankle and foot, without tophus (tophi): Secondary | ICD-10-CM | POA: Diagnosis not present

## 2020-03-10 DIAGNOSIS — Z125 Encounter for screening for malignant neoplasm of prostate: Secondary | ICD-10-CM

## 2020-03-10 DIAGNOSIS — R5381 Other malaise: Secondary | ICD-10-CM

## 2020-03-10 DIAGNOSIS — E559 Vitamin D deficiency, unspecified: Secondary | ICD-10-CM | POA: Diagnosis not present

## 2020-03-10 NOTE — Progress Notes (Signed)
Chief Complaint: This 69 year old man comes in for an annual physical exam. HPI: He has a history of gout and follows rheumatology for this. He also had a history of vitamin D deficiency but he is taking a lower dose of vitamin D3 that I previously recommended as he felt that he was getting side effects.  Past Medical History:  Diagnosis Date  . Gout 10/06/2019   Past Surgical History:  Procedure Laterality Date  . HEMORRHOID SURGERY N/A 07/04/2013   Procedure: HEMORRHOIDECTOMY;  Surgeon: Jamesetta So, MD;  Location: [facility];  Service: General;  Laterality: N/A;  . HERNIA REPAIR     Dr Leda Roys  . KNEE SURGERY Left    arthroscopy  . 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    Social History   Tobacco Use  . Smoking status: Former Smoker    Packs/day: 2.00    Years: 30.00    Pack years: 60.00    Types: Cigarettes    Quit date: 06/30/2002    Years since quitting: 17.7  . Smokeless tobacco: Never Used  Substance Use Topics  . Alcohol use: No    Comment: clean for 16 years      Allergies:  Allergies  Allergen Reactions  . Penicillins Other (See Comments)    Has patient had a PCN reaction causing immediate rash, facial/tongue/throat swelling, SOB or lightheadedness with hypotension: unknown Has patient had a PCN reaction causing severe rash involving mucus membranes or skin necrosis:unknown Has patient had a PCN reaction that required hospitalization unknown Has patient had a PCN reaction occurring within the last 10 years:unknown If all of the above answers are "NO", then may proceed with Cephalosporin use.      Current Meds  Medication Sig  . allopurinol (ZYLOPRIM) 300 MG tablet Take 1 tablet by mouth daily.  Marland Kitchen ascorbic acid (VITAMIN C) 1000 MG tablet Take 1,000 mg by mouth daily.  .  Cholecalciferol (VITAMIN D3 PO) Take 1,200 Units by mouth daily.   . Colchicine 0.6 MG CAPS Take 1 capsule by mouth daily.      ELF:YBOFB from the symptoms mentioned above,there are no other symptoms referable to all systems reviewed.  Physical Exam: Blood pressure 130/80, pulse (!) 50, temperature 97.6 F (36.4 C), temperature source Temporal, height 6\' 3"  (1.905 m), weight 214 lb (97.1 kg), SpO2 97 %. Vitals with BMI 03/10/2020 10/06/2019 11/27/2018  Height 6\' 3"  6\' 3"  6\' 3"   Weight 214 lbs 210 lbs 240 lbs  BMI 51.02 58.52 30  Systolic 778 242 353  Diastolic 80 80 81  Pulse 50 54 55      Looks systemically well. General: Alert, cooperative, and appears to be the stated age.No pallor.  No jaundice.  No clubbing. Head: Normocephalic Eyes: Sclera white, pupils equal and reactive to light, red reflex x 2,  Ears: Normal bilaterally Oral cavity: Lips, mucosa, and tongue normal: Teeth and gums normal Neck: No adenopathy, supple, symmetrical, trachea midline, and thyroid does not appear enlarged Respiratory: Clear to auscultation bilaterally.No wheezing, crackles or bronchial breathing. Cardiovascular: Heart sounds are present and appear to be normal without murmurs or added sounds.  No carotid bruits.  Peripheral pulses are present and equal bilaterally.: Gastrointestinal:positive bowel sounds, no hepatosplenomegaly.  No masses felt.No tenderness. Skin: Clear, No rashes noted.No worrisome skin lesions seen. Neurological: Grossly intact without focal  findings, cranial nerves II through XII intact, muscle strength equal bilaterally Musculoskeletal: No acute joint abnormalities noted.Full range of movement noted with joints. Psychiatric: Affect appropriate, non-anxious.    Assessment  1. Chronic gout of foot, unspecified cause, unspecified laterality   2. Encounter for general adult medical examination with abnormal findings   3. Malaise and fatigue   4. Vitamin D deficiency disease    5. Special screening for malignant neoplasm of prostate   6. Screening for diabetes mellitus   7. Screening for lipoid disorders     Tests Ordered:   Orders Placed This Encounter  Procedures  . CBC  . COMPLETE METABOLIC PANEL WITH GFR  . Hemoglobin A1c  . Lipid panel  . PSA  . T3, free  . T4  . TSH  . VITAMIN D 25 Hydroxy (Vit-D Deficiency, Fractures)     Plan  1. Blood work is ordered above. 2. Further recommendations will depend on blood results and I will have him follow-up with Sarah in about 3 months time.     No orders of the defined types were placed in this encounter.    Natalie Leclaire C Deovion Batrez   03/10/2020, 11:13 AM

## 2020-03-11 ENCOUNTER — Ambulatory Visit: Payer: Medicare HMO | Admitting: General Surgery

## 2020-03-11 ENCOUNTER — Encounter: Payer: Self-pay | Admitting: General Surgery

## 2020-03-11 ENCOUNTER — Other Ambulatory Visit: Payer: Self-pay

## 2020-03-11 VITALS — BP 126/75 | HR 59 | Temp 97.7°F | Resp 14 | Ht 75.0 in | Wt 211.0 lb

## 2020-03-11 DIAGNOSIS — L929 Granulomatous disorder of the skin and subcutaneous tissue, unspecified: Secondary | ICD-10-CM

## 2020-03-11 LAB — COMPLETE METABOLIC PANEL WITH GFR
AG Ratio: 2.2 (calc) (ref 1.0–2.5)
ALT: 28 U/L (ref 9–46)
AST: 20 U/L (ref 10–35)
Albumin: 4.7 g/dL (ref 3.6–5.1)
Alkaline phosphatase (APISO): 73 U/L (ref 35–144)
BUN: 16 mg/dL (ref 7–25)
CO2: 27 mmol/L (ref 20–32)
Calcium: 10.6 mg/dL — ABNORMAL HIGH (ref 8.6–10.3)
Chloride: 104 mmol/L (ref 98–110)
Creat: 1.11 mg/dL (ref 0.70–1.25)
GFR, Est African American: 80 mL/min/{1.73_m2} (ref >=60)
GFR, Est Non African American: 69 mL/min/{1.73_m2} (ref >=60)
Globulin: 2.1 g/dL (calc) (ref 1.9–3.7)
Glucose, Bld: 110 mg/dL — ABNORMAL HIGH (ref 65–99)
Potassium: 4.4 mmol/L (ref 3.5–5.3)
Sodium: 139 mmol/L (ref 135–146)
Total Bilirubin: 0.8 mg/dL (ref 0.2–1.2)
Total Protein: 6.8 g/dL (ref 6.1–8.1)

## 2020-03-11 LAB — CBC
HCT: 50.8 % — ABNORMAL HIGH (ref 38.5–50.0)
Hemoglobin: 17.1 g/dL (ref 13.2–17.1)
MCH: 30.2 pg (ref 27.0–33.0)
MCHC: 33.7 g/dL (ref 32.0–36.0)
MCV: 89.6 fL (ref 80.0–100.0)
MPV: 10.2 fL (ref 7.5–12.5)
Platelets: 237 10*3/uL (ref 140–400)
RBC: 5.67 10*6/uL (ref 4.20–5.80)
RDW: 12.4 % (ref 11.0–15.0)
WBC: 7.4 10*3/uL (ref 3.8–10.8)

## 2020-03-11 LAB — T4: T4, Total: 6.7 ug/dL (ref 4.9–10.5)

## 2020-03-11 LAB — HEMOGLOBIN A1C
Hgb A1c MFr Bld: 5.6 % of total Hgb (ref <5.7)
Mean Plasma Glucose: 114 (calc)
eAG (mmol/L): 6.3 (calc)

## 2020-03-11 LAB — T3, FREE: T3, Free: 3.5 pg/mL (ref 2.3–4.2)

## 2020-03-11 LAB — PSA: PSA: 1.4 ng/mL (ref <=4.0)

## 2020-03-11 LAB — VITAMIN D 25 HYDROXY (VIT D DEFICIENCY, FRACTURES): Vit D, 25-Hydroxy: 64 ng/mL (ref 30–100)

## 2020-03-11 LAB — LIPID PANEL
Cholesterol: 194 mg/dL (ref <200)
HDL: 46 mg/dL (ref >=40)
LDL Cholesterol (Calc): 114 mg/dL (calc) — ABNORMAL HIGH
Non-HDL Cholesterol (Calc): 148 mg/dL (calc) — ABNORMAL HIGH (ref <130)
Total CHOL/HDL Ratio: 4.2 (calc) (ref <5.0)
Triglycerides: 225 mg/dL — ABNORMAL HIGH (ref <150)

## 2020-03-11 LAB — TSH: TSH: 1.32 mIU/L (ref 0.40–4.50)

## 2020-03-11 NOTE — Progress Notes (Signed)
[name]; [id number]; 29-Oct-1951   HPI Patient is a 69 year old white male who was referred to my care by Dr. Karilyn Cota for evaluation and treatment of a knot around his anus.  Patient states that has been present for 6 months and has had no drainage or change in size.  Occasionally he may get a stinging sensation when pressure is applied.  He currently has 0 out of 10 pain.  He is status post hemorrhoidectomy in the remote past as well as an incision and drainage of a thrombosed hemorrhoid in the past.  He wanted me to check it out to make sure it was not something more serious. Past Medical History:  Diagnosis Date  . Gout 10/06/2019    Past Surgical History:  Procedure Laterality Date  . HEMORRHOID SURGERY N/A 07/04/2013   Procedure: HEMORRHOIDECTOMY;  Surgeon: Dalia Heading, MD;  Location: [facility];  Service: General;  Laterality: N/A;  . HERNIA REPAIR     Dr Dickey Gave  . KNEE SURGERY Left    arthroscopy  . TONSILECTOMY/ADENOIDECTOMY WITH MYRINGOTOMY      Family History  Problem Relation Age of Onset  . Cancer Mother   . Pulmonary disease Mother   . Leukemia Father     Current Outpatient Medications on File Prior to Visit  Medication Sig Dispense Refill  . allopurinol (ZYLOPRIM) 300 MG tablet Take 1 tablet by mouth daily.    Marland Kitchen ascorbic acid (VITAMIN C) 1000 MG tablet Take 1,000 mg by mouth daily.    . Cholecalciferol (VITAMIN D3 PO) Take 1,200 Units by mouth daily.     . Colchicine 0.6 MG CAPS Take 1 capsule by mouth daily.     No current facility-administered medications on file prior to visit.    Allergies  Allergen Reactions  . Penicillins Other (See Comments)    Has patient had a PCN reaction causing immediate rash, facial/tongue/throat swelling, SOB or lightheadedness with hypotension: unknown Has patient had a PCN reaction causing severe rash involving mucus membranes or skin necrosis:unknown Has patient had a PCN reaction that required hospitalization unknown Has  patient had a PCN reaction occurring within the last 10 years:unknown If all of the above answers are "NO", then may proceed with Cephalosporin use.     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 Packs/day: 2.00  . Years: 30.00  . Pack years: 60.00  . Types: Cigarettes  . Quit date: 06/30/2002  . Years since quitting: 17.7  Smokeless Tobacco Never Used    Review of Systems  Constitutional: Negative.   HENT: Negative.   Eyes: Negative.   Respiratory: Negative.   Cardiovascular: Negative.   Gastrointestinal: Negative.   Genitourinary: Negative.   Musculoskeletal: Negative.   Skin: Negative.   Neurological: Negative.   Endo/Heme/Allergies: Negative.   Psychiatric/Behavioral: Negative.     Objective   Vitals:   03/11/20 0854  BP: 126/75  Pulse: (!) 59  Resp: 14  Temp: 97.7 F (36.5 C)  SpO2: 95%    Physical Exam Vitals reviewed.  Constitutional:      Appearance: Normal appearance. He is normal weight. He is not ill-appearing.  HENT:     Head: Normocephalic and atraumatic.  Cardiovascular:     Rate and Rhythm: Normal rate and regular rhythm.     Heart sounds: Normal heart sounds. No murmur.  No friction rub. No gallop.   Pulmonary:     Effort: Pulmonary effort is normal. No respiratory distress.     Breath sounds: Normal breath sounds. No stridor. No wheezing, rhonchi or rales.  Genitourinary:    Comments: Rectal examination reveals a punctate superficial granuloma to the right of the anus.  No induration or erythema is noted.  Anal skin appears within normal limits. Skin:    General: Skin is warm and dry.  Neurological:     Mental Status: He is alert and oriented to person, place, and time.     Assessment  Granuloma, perianal.  This appears to be a benign process. Plan   There is no need for surgical excision unless there is a change  in size or the patient starts having drainage or increased discomfort from this.  He understands and agrees.  Follow-up as needed.

## 2020-06-10 ENCOUNTER — Encounter (INDEPENDENT_AMBULATORY_CARE_PROVIDER_SITE_OTHER): Payer: Self-pay | Admitting: Nurse Practitioner

## 2020-06-10 ENCOUNTER — Other Ambulatory Visit: Payer: Self-pay

## 2020-06-10 ENCOUNTER — Ambulatory Visit (INDEPENDENT_AMBULATORY_CARE_PROVIDER_SITE_OTHER): Payer: Medicare HMO | Admitting: Nurse Practitioner

## 2020-06-10 VITALS — BP 130/85 | HR 51 | Temp 96.6°F | Ht 75.0 in | Wt 214.0 lb

## 2020-06-10 DIAGNOSIS — E782 Mixed hyperlipidemia: Secondary | ICD-10-CM | POA: Diagnosis not present

## 2020-06-10 DIAGNOSIS — E559 Vitamin D deficiency, unspecified: Secondary | ICD-10-CM

## 2020-06-10 DIAGNOSIS — Z125 Encounter for screening for malignant neoplasm of prostate: Secondary | ICD-10-CM

## 2020-06-10 DIAGNOSIS — R7309 Other abnormal glucose: Secondary | ICD-10-CM

## 2020-06-10 NOTE — Progress Notes (Signed)
Subjective:  Patient ID: [name], male    DOB: December 07, 1951  Age: 69 y.o. MRN: [id number]  CC:  Chief Complaint  Patient presents with  . Other    Hypercalcemia, vitamin D deficiency, request for prostate cancer screening  . Hyperlipidemia      HPI  This patient comes in today for the above.  Hypercalcemia: Blood work was drawn back in March 2021 and did show calcium level of 10.6.  Patient is generally feeling well.  Vitamin D deficiency: He continues on his vitamin D3 supplement.  Last serum check was collected in March 2021 and showed level at 64.  Hyperlipidemia: Last lipid panel was collected in March 2021 and it did showed elevated LDL and triglycerides.  I do not believe this was a fasting panel.  Request for prostate cancer screening and repeat A1c: He does mention that he has a weakened stream of urine intermittently.  I do see per chart review he had a PSA collected in March 2021 and it was 1.4.  He would like to have digital rectal exam completed today to have his prostate checked.  He denies any low back pain or hematuria.  He would also like a repeat A1c, he tells me he has quite a sweet tooth and I think he is concerned about developing diabetes.  Last A1c was collected approximately 3 months ago and it was 5.6.    Past Medical History:  Diagnosis Date  . Gout 10/06/2019      Family History  Problem Relation Age of Onset  . Cancer Mother   . Pulmonary disease Mother   . 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   Social History   Tobacco Use  . Smoking status: Former Smoker    Packs/day: 2.00    Years: 30.00    Pack years: 60.00    Types: Cigarettes    Quit date: 06/30/2002    Years since quitting: 17.9  . Smokeless tobacco: Never Used  Substance Use Topics  . Alcohol use: No    Comment: clean for 16  years     Current Meds  Medication Sig  . allopurinol (ZYLOPRIM) 300 MG tablet Take 1 tablet by mouth daily.  Marland Kitchen ascorbic acid (VITAMIN C) 1000 MG tablet Take 1,000 mg by mouth daily.  . Cholecalciferol (VITAMIN D3 PO) Take 1,200 Units by mouth daily.   . Colchicine 0.6 MG CAPS Take 1 capsule by mouth daily.    ROS:  Review of Systems  Constitutional: Positive for malaise/fatigue.  Eyes: Negative for blurred vision.  Respiratory: Negative for shortness of breath.   Cardiovascular: Negative for chest pain and palpitations.  Genitourinary: Negative for hematuria.  Musculoskeletal: Negative for back pain.  Neurological: Negative for dizziness and headaches.     Objective:   Today's Vitals: BP 130/85 (BP Location: Left Arm, Patient Position: Sitting, Cuff Size: Normal)   Pulse (!) 51   Temp (!) 96.6 F (35.9 C) (Temporal)   Ht 6\' 3"  (1.905 m)   Wt 214 lb (97.1 kg)   SpO2 97%   BMI 26.75 kg/m  Vitals with BMI 06/10/2020 03/11/2020 03/10/2020  Height 6\' 3"  6\' 3"  6\' 3"   Weight 214 lbs 211 lbs 214 lbs  BMI 26.75 26.37 26.75  Systolic 130 126 03/12/2020  Diastolic 85 75 80  Pulse 51 59 50  Physical Exam Vitals reviewed. Exam conducted with a chaperone present.  Constitutional:      Appearance: Normal appearance.  HENT:     Head: Normocephalic and atraumatic.  Cardiovascular:     Rate and Rhythm: Normal rate and regular rhythm.  Pulmonary:     Effort: Pulmonary effort is normal.     Breath sounds: Normal breath sounds.  Genitourinary:    Prostate: Normal. Not enlarged, not tender and no nodules present.     Rectum: Normal.  Musculoskeletal:     Cervical back: Neck supple.  Skin:    General: Skin is warm and dry.  Neurological:     Mental Status: He is alert and oriented to person, place, and time.  Psychiatric:        Mood and Affect: Mood normal.        Behavior: Behavior normal.        Thought Content: Thought content normal.        Judgment: Judgment normal.           Assessment and Plan   1. Mixed hyperlipidemia   2. Elevated glucose   3. Hypercalcemia   4. Prostate cancer screening   5. Vitamin D deficiency disease      Plan: I discussed lifestyle and dietary recommendations to help better control his cholesterol.  I will give him a handout regarding this information as well.  We will check A1c, ionized calcium, and PTH level for further evaluation of his hypercalcemia today.  Digital rectal exam was normal, and I did offer him reassurance today.  Consider rechecking PSA again in March 2022.   Tests ordered Orders Placed This Encounter  Procedures  . Calcium, ionized  . PTH, Intact and Calcium  . Hemoglobin A1c      No orders of the defined types were placed in this encounter.   Patient to follow-up in 3 months  Ailene Ards, NP

## 2020-06-10 NOTE — Patient Instructions (Signed)
[facility] Dietary Recommendations for Weight Loss What to Avoid . Avoid added sugars o Often added sugar can be found in processed foods such as many condiments, dry cereals, cakes, cookies, chips, crisps, crackers, candies, sweetened drinks, etc.  o Read labels and AVOID/DECREASE use of foods with the following in their ingredient list: Sugar, fructose, high fructose corn syrup, sucrose, glucose, maltose, dextrose, molasses, cane sugar, brown sugar, any type of syrup, agave nectar, etc.   . Avoid snacking in between meals . Avoid foods made with flour o If you are going to eat food made with flour, choose those made with whole-grains; and, minimize your consumption as much as is tolerable . Avoid processed foods o These foods are generally stocked in the middle of the grocery store. Focus on shopping on the perimeter of the grocery.  . Avoid Meat  o We recommend following a plant-based diet at [facility]. Thus, we recommend avoiding meat as a general rule. Consider eating beans, legumes, eggs, and/or dairy products for regular protein sources o If you plan on eating meat limit to 4 ounces of meat at a time and choose lean options such as Fish, chicken, turkey. Avoid red meat intake such as pork and/or steak What to Include . Vegetables o GREEN LEAFY VEGETABLES: Kale, spinach, mustard greens, collard greens, cabbage, broccoli, etc. o OTHER: Asparagus, cauliflower, eggplant, carrots, peas, Brussel sprouts, tomatoes, bell peppers, zucchini, beets, cucumbers, etc. . Grains, seeds, and legumes o Beans: kidney beans, black eyed peas, garbanzo beans, black beans, pinto beans, etc. o Whole, unrefined grains: brown rice, barley, bulgur, oatmeal, etc. . Healthy fats  o Avoid highly processed fats such as vegetable oil o Examples of healthy fats: avocado, olives, virgin olive oil, dark chocolate (?72% Cocoa), nuts (peanuts, almonds, walnuts, cashews, pecans, etc.) . None to Low  Intake of Animal Sources of Protein o Meat sources: chicken, turkey, salmon, tuna. Limit to 4 ounces of meat at one time. o Consider limiting dairy sources, but when choosing dairy focus on: PLAIN Greek yogurt, cottage cheese, high-protein milk . Fruit o Choose berries  When to Eat . Intermittent Fasting: o Choosing not to eat for a specific time period, but DO FOCUS ON HYDRATION when fasting o Multiple Techniques: - Time Restricted Eating: eat 3 meals in a day, each meal lasting no more than 60 minutes, no snacks between meals - 16-18 hour fast: fast for 16 to 18 hours up to 7 days a week. Often suggested to start with 2-3 nonconsecutive days per week.  . Remember the time you sleep is counted as fasting.  . Examples of eating schedule: Fast from 7:00pm-11:00am. Eat between 11:00am-7:00pm.  - 24-hour fast: fast for 24 hours up to every other day. Often suggested to start with 1 day per week . Remember the time you sleep is counted as fasting . Examples of eating schedule:  o Eating day: eat 2-3 meals on your eating day. If doing 2 meals, each meal should last no more than 90 minutes. If doing 3 meals, each meal should last no more than 60 minutes. Finish last meal by 7:00pm. o Fasting day: Fast until 7:00pm.  o IF YOU FEEL UNWELL FOR ANY REASON/IN ANY WAY WHEN FASTING, STOP FASTING BY EATING A NUTRITIOUS SNACK OR LIGHT MEAL o ALWAYS FOCUS ON HYDRATION DURING FASTS - Acceptable Hydration sources: water, broths, tea/coffee (black tea/coffee is best but using a small amount of whole-fat dairy products in coffee/tea is acceptable).  -   Poor Hydration Sources: anything with sugar or artificial sweeteners added to it  These recommendations have been developed for patients that are actively receiving medical care from either Dr. Gosrani or Xzavion Doswell, DNP, NP-C at [facility]. These recommendations are developed for patients with specific medical conditions and are not meant to be  distributed or used by others that are not actively receiving care from either provider listed above at [facility]. It is not appropriate to participate in the above eating plans without proper medical supervision.   Reference: Fung, J. The obesity code. [place]: [place]; 2016.   

## 2020-06-11 LAB — PTH, INTACT AND CALCIUM
Calcium: 10.3 mg/dL (ref 8.6–10.3)
PTH: 52 pg/mL (ref 14–64)

## 2020-06-11 LAB — HEMOGLOBIN A1C
Hgb A1c MFr Bld: 5.6 % of total Hgb (ref <5.7)
Mean Plasma Glucose: 114 (calc)
eAG (mmol/L): 6.3 (calc)

## 2020-06-11 LAB — CALCIUM, IONIZED: Calcium, Ion: 5.46 mg/dL (ref 4.8–5.6)

## 2020-07-08 ENCOUNTER — Ambulatory Visit (INDEPENDENT_AMBULATORY_CARE_PROVIDER_SITE_OTHER): Payer: Medicare HMO | Admitting: Nurse Practitioner

## 2020-11-23 ENCOUNTER — Ambulatory Visit (INDEPENDENT_AMBULATORY_CARE_PROVIDER_SITE_OTHER): Payer: Medicare HMO | Admitting: Nurse Practitioner

## 2020-11-23 ENCOUNTER — Other Ambulatory Visit: Payer: Self-pay

## 2020-11-23 ENCOUNTER — Encounter (INDEPENDENT_AMBULATORY_CARE_PROVIDER_SITE_OTHER): Payer: Self-pay | Admitting: Nurse Practitioner

## 2020-11-23 VITALS — BP 148/68 | HR 65 | Temp 97.3°F | Ht 75.0 in | Wt 211.6 lb

## 2020-11-23 DIAGNOSIS — M1A0790 Idiopathic chronic gout, unspecified ankle and foot, without tophus (tophi): Secondary | ICD-10-CM

## 2020-11-23 DIAGNOSIS — E559 Vitamin D deficiency, unspecified: Secondary | ICD-10-CM | POA: Diagnosis not present

## 2020-11-23 DIAGNOSIS — R7309 Other abnormal glucose: Secondary | ICD-10-CM | POA: Diagnosis not present

## 2020-11-23 DIAGNOSIS — E782 Mixed hyperlipidemia: Secondary | ICD-10-CM

## 2020-11-23 DIAGNOSIS — Z0000 Encounter for general adult medical examination without abnormal findings: Secondary | ICD-10-CM

## 2020-11-23 NOTE — Progress Notes (Signed)
Subjective:  Patient ID: [name], male    DOB: [birth year]  Age: 69 y.o. MRN: [id number]  CC:  Chief Complaint  Patient presents with  . Follow-up    concerned about taking allopurinol, wantds kidney levels checked and vitamin D level checked  . Other    Health maintenance, hyperglycemia, hypercalcemia, history of vitamin D deficiency  . Gout  . Hyperlipidemia      HPI  This patient arrives today for the above.  Health maintenance: He is concerned regarding cancer screenings and is wondering if he can have some blood work to accomplish this today.  He tells me he had a colon cancer screening with [facility]olog[facility] about a year and a half ago and it was negative.  Per chart review I do see last PSA was collected in March 2021, level was 1.4.  He would be due for flu shot but tells me he does not want to have this administered today.  Hyperglycemia: He has a history of hyperglycemia.  Last A1c was 5.6.  He has been trying to focus on diet and avoid sugary foods and beverages.  Hypercalcemia: Has had hypercalcemia in the past.  In June 2021 repeat PTH and calcium showed normal levels.  History of vitamin D deficiency: He tells me he was told that his vitamin D level was low in the past.  He does continue on vitamin D3 supplement.  He takes 10,000 IUs a few times a week.  Last serum level was collected in March 2021 was 64.  Gout: He also has a history of gout and continues on allopurinol.  He is concerned regarding whether or not his kidney function is normal.  Last metabolic panel did show a GFR of 69.  With creatinine of 1.11 and BUN of 16.  He tells me he is scheduled to follow-up with his doctor that treats his gout within the next month or 2.  Hyperlipidemia: Last LDL was collected in March 2021 it was 114.  He is not any medications to control his hyperlipidemia currently.  Past Medical History:  Diagnosis Date  . Gout 10/06/2019      Family History  Problem Relation Age  of Onset  . Cancer Mother   . Pulmonary disease Mother   . 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   Social History   Tobacco Use  . Smoking status: Former Smoker    Packs/day: 2.00    Years: 30.00    Pack years: 60.00    Types: Cigarettes    Quit date: 06/30/2002    Years since quitting: 18.4  . Smokeless tobacco: Never Used  Substance Use Topics  . 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 allopurinol (ZYLOPRIM) 300 MG tablet Take 1 tablet by mouth daily.  Marland Kitchen ascorbic acid (VITAMIN C) 1000 MG tablet Take 1,000 mg by mouth daily.  . Cholecalciferol (VITAMIN D3 PO) Take 1,200 Units by mouth daily.   . Colchicine 0.6 MG CAPS Take 1 capsule by mouth daily.    ROS:  Review of Systems  Constitutional: Negative for fever and weight loss.  Respiratory: Negative for shortness of breath.   Cardiovascular: Negative for chest pain and palpitations.  Neurological: Negative for dizziness and headaches.  Psychiatric/Behavioral: Negative for depression and  suicidal ideas.     Objective:   Today's Vitals: BP (!) 148/68   Pulse 65   Temp (!) 97.3 F (36.3 C) (Temporal)   Ht 6\' 3"  (1.905 m)   Wt 211 lb 9.6 oz (96 kg)   SpO2 97%   BMI 26.45 kg/m  Vitals with BMI 11/23/2020 06/10/2020 03/11/2020  Height 6\' 3"  6\' 3"  6\' 3"   Weight 211 lbs 10 oz 214 lbs 211 lbs  BMI 26.45 26.75 26.37  Systolic 148 130 03/13/2020  Diastolic 68 85 75  Pulse 65 51 59     Physical Exam Vitals reviewed.  Constitutional:      Appearance: Normal appearance.  HENT:     Head: Normocephalic and atraumatic.  Neck:     Vascular: No carotid bruit.  Cardiovascular:     Rate and Rhythm: Normal rate and regular rhythm.  Pulmonary:     Effort: Pulmonary effort is normal.     Breath sounds: Normal breath sounds.  Musculoskeletal:      Cervical back: Neck supple.  Skin:    General: Skin is warm and dry.  Neurological:     Mental Status: He is alert and oriented to person, place, and time.  Psychiatric:        Mood and Affect: Mood normal.        Behavior: Behavior normal.        Thought Content: Thought content normal.        Judgment: Judgment normal.          Assessment and Plan   1. Chronic gout of foot, unspecified cause, unspecified laterality   2. Elevated glucose   3. Hypercalcemia   4. Vitamin D deficiency disease   5. Mixed hyperlipidemia   6. Healthcare maintenance      Plan: 1.  I did discuss his last metabolic panel and did tell him that this does reflect some mild reduction in kidney function.  We did discuss that he could potentially consider reducing his dose of allopurinol he is concerned about its effects on his kidneys.  He plans on discussing this with his provider to treat his gout in the next month or 2.  For now he will continue on his medications as prescribed. 2.-6.  I went over last blood work results including last PSA level, A1c, and vitamin D level.  Per shared decision making, the will complete repeat blood work at next office visit at which point we will check A1c, CMP, vitamin D level, lipid panel, and complete screenings for thyroid dysfunction, PSA and hepatitis C.   Tests ordered No orders of the defined types were placed in this encounter.     No orders of the defined types were placed in this encounter.   Patient to follow-up in 3 months or sooner as needed.  , NP

## 2020-11-29 ENCOUNTER — Ambulatory Visit (INDEPENDENT_AMBULATORY_CARE_PROVIDER_SITE_OTHER): Payer: Medicare HMO | Admitting: Internal Medicine

## 2021-02-23 ENCOUNTER — Other Ambulatory Visit: Payer: Self-pay

## 2021-02-23 ENCOUNTER — Encounter (INDEPENDENT_AMBULATORY_CARE_PROVIDER_SITE_OTHER): Payer: Self-pay | Admitting: Nurse Practitioner

## 2021-02-23 ENCOUNTER — Ambulatory Visit (INDEPENDENT_AMBULATORY_CARE_PROVIDER_SITE_OTHER): Payer: Medicare HMO | Admitting: Nurse Practitioner

## 2021-02-23 VITALS — BP 134/80 | HR 49 | Temp 97.5°F | Ht 75.0 in | Wt 212.2 lb

## 2021-02-23 DIAGNOSIS — E559 Vitamin D deficiency, unspecified: Secondary | ICD-10-CM

## 2021-02-23 DIAGNOSIS — Z125 Encounter for screening for malignant neoplasm of prostate: Secondary | ICD-10-CM

## 2021-02-23 DIAGNOSIS — R5383 Other fatigue: Secondary | ICD-10-CM | POA: Diagnosis not present

## 2021-02-23 DIAGNOSIS — E782 Mixed hyperlipidemia: Secondary | ICD-10-CM

## 2021-02-23 DIAGNOSIS — R739 Hyperglycemia, unspecified: Secondary | ICD-10-CM

## 2021-02-23 DIAGNOSIS — Z1159 Encounter for screening for other viral diseases: Secondary | ICD-10-CM

## 2021-02-23 DIAGNOSIS — M1A0790 Idiopathic chronic gout, unspecified ankle and foot, without tophus (tophi): Secondary | ICD-10-CM

## 2021-02-23 NOTE — Progress Notes (Signed)
Subjective:  Patient ID: [name], male    DOB: March 29, 1951  Age: 70 y.o. MRN: [id number]  CC:  Chief Complaint  Patient presents with  . Follow-up    Wants labs today, doing okay  . Gout  . Other    Vitamin D deficiency, hyperglycemia, prostate cancer screening, hepatitis C screening, health maintenance  . Hyperlipidemia      HPI  This patient arrives today for the above.  Gout: He continues on 300 mg daily.  He tells me he had his uric acid level checked with his rheumatologist recently and it was quite low around 2.  Vitamin D deficiency: He continues on 10,000 IUs of vitamin D3 every few days.  He is tolerating this well.  Last serum check was collected approximately 1 year ago and it was 64.  Hyperglycemia: He has had some hyperglycemia noted on metabolic panels in the past.  He is due to have A1c checked today.  In the past it was 5.6.  Hyperlipidemia: He has a history of elevated cholesterol with last LDL of 114 and triglycerides of 225.  He is due to have lipid panel checked today.  Prostate cancer screening: Last PSA was collected approximately 1 year ago and it was 1.4.  Hepatitis C screening: He is now been screened for hepatitis C and is agreeable to being screened for this today.  Health maintenance: He is due for pneumonia vaccine, COVID-19 vaccine, and tetanus vaccine.  He is not interested in being vaccinated at this time.  He does mention that he has been feeling a bit fatigued lately and has recently started taking a vitamin B12 supplement over-the-counter.  He would like to have more blood work collected for further evaluation of this today.  Past Medical History:  Diagnosis Date  . Gout 10/06/2019      Family History  Problem Relation Age of Onset  . Cancer Mother   . Pulmonary disease Mother   . 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   Social History   Tobacco Use  . Smoking status: Former Smoker    Packs/day: 2.00    Years: 30.00    Pack years: 60.00    Types: Cigarettes    Quit date: 06/30/2002    Years since quitting: 18.6  . Smokeless tobacco: Never Used  Substance Use Topics  . 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 allopurinol (ZYLOPRIM) 300 MG tablet Take 1 tablet by mouth daily.  Marland Kitchen ascorbic acid (VITAMIN C) 1000 MG tablet Take 1,000 mg by mouth daily.  . Cholecalciferol (VITAMIN D3 PO) Take 10,000 Units by mouth every 3 (three) days.  . Colchicine 0.6 MG CAPS Take 1 capsule by mouth daily.  . vitamin B-12 (CYANOCOBALAMIN) 50 MCG tablet Take 50 mcg by mouth daily.    ROS:  Review of Systems  Constitutional: Positive for malaise/fatigue. Negative for fever and weight loss.  HENT: Positive for congestion.   Eyes: Negative for blurred vision.  Respiratory: Negative for shortness of breath.   Cardiovascular: Negative for chest pain.  Gastrointestinal: Negative for abdominal pain.  Neurological: Negative for dizziness and headaches.     Objective:   Today's Vitals: BP 134/80   Pulse (!) 49   Temp (!) 97.5 F (36.4  C) (Temporal)   Ht '6\' 3"'  (1.905 m)   Wt 212 lb 3.2 oz (96.3 kg)   SpO2 98%   BMI 26.52 kg/m  Vitals with BMI 02/23/2021 11/23/2020 06/10/2020  Height '6\' 3"'  '6\' 3"'  '6\' 3"'   Weight 212 lbs 3 oz 211 lbs 10 oz 214 lbs  BMI 26.52 85.02 77.41  Systolic 287 867 672  Diastolic 80 68 85  Pulse 49 65 51     Physical Exam Vitals reviewed.  Constitutional:      Appearance: Normal appearance.  HENT:     Head: Normocephalic and atraumatic.  Cardiovascular:     Rate and Rhythm: Normal rate and regular rhythm.  Pulmonary:     Effort: Pulmonary effort is normal.     Breath sounds: Normal breath sounds.  Musculoskeletal:     Cervical back: Neck supple.  Skin:    General: Skin is warm and dry.   Neurological:     Mental Status: He is alert and oriented to person, place, and time.  Psychiatric:        Mood and Affect: Mood normal.        Behavior: Behavior normal.        Thought Content: Thought content normal.        Judgment: Judgment normal.          Assessment and Plan   1. Fatigue, unspecified type   2. Vitamin D deficiency disease   3. Mixed hyperlipidemia   4. Prostate cancer screening   5. Hyperglycemia   6. Encounter for hepatitis C screening test for low risk patient   7. Chronic gout of foot, unspecified cause, unspecified laterality      Plan: 1.  We will collect blood work for further evaluation. 2.  We will check serum vitamin D level. 3.  We will check lipid panel. 4.  We will screen for prostate cancer by collecting PSA. 5.  We will check A1c for further evaluation today. 6.  We will screen for hepatitis C today. 7.  Appears to be pretty stable at this time.  Per patient preference will not check uric acid today as he reports having had it completed at rheumatologist office recently.   Tests ordered Orders Placed This Encounter  Procedures  . CBC with Differential/Platelets  . CMP with eGFR([facility])  . Testosterone Total,Free,Bio, Males  . Lipid Panel  . Hemoglobin A1c  . TSH  . PSA  . Hep C Antibody  . Vitamin D, 25-hydroxy  . Vitamin B12      No orders of the defined types were placed in this encounter.   Patient to follow-up in 3 months or sooner as needed.  Ailene Ards, NP

## 2021-02-24 LAB — CBC WITH DIFFERENTIAL/PLATELET
Absolute Monocytes: 639 cells/uL (ref 200–950)
Basophils Absolute: 71 cells/uL (ref 0–200)
Basophils Relative: 1 %
Eosinophils Absolute: 348 cells/uL (ref 15–500)
Eosinophils Relative: 4.9 %
HCT: 46.2 % (ref 38.5–50.0)
Hemoglobin: 15.7 g/dL (ref 13.2–17.1)
Lymphs Abs: 1562 cells/uL (ref 850–3900)
MCH: 29.9 pg (ref 27.0–33.0)
MCHC: 34 g/dL (ref 32.0–36.0)
MCV: 88 fL (ref 80.0–100.0)
MPV: 10.1 fL (ref 7.5–12.5)
Monocytes Relative: 9 %
Neutro Abs: 4480 cells/uL (ref 1500–7800)
Neutrophils Relative %: 63.1 %
Platelets: 249 10*3/uL (ref 140–400)
RBC: 5.25 10*6/uL (ref 4.20–5.80)
RDW: 12.6 % (ref 11.0–15.0)
Total Lymphocyte: 22 %
WBC: 7.1 10*3/uL (ref 3.8–10.8)

## 2021-02-24 LAB — TESTOSTERONE TOTAL,FREE,BIO, MALES
Albumin: 4.3 g/dL (ref 3.6–5.1)
Sex Hormone Binding: 45 nmol/L (ref 22–77)
Testosterone, Bioavailable: 128.9 ng/dL (ref >=110.0)
Testosterone, Free: 65.4 pg/mL (ref 46.0–224.0)
Testosterone: 609 ng/dL (ref 250–827)

## 2021-02-24 LAB — COMPLETE METABOLIC PANEL WITH GFR
AG Ratio: 2.2 (calc) (ref 1.0–2.5)
ALT: 19 U/L (ref 9–46)
AST: 16 U/L (ref 10–35)
Albumin: 4.3 g/dL (ref 3.6–5.1)
Alkaline phosphatase (APISO): 64 U/L (ref 35–144)
BUN: 13 mg/dL (ref 7–25)
CO2: 23 mmol/L (ref 20–32)
Calcium: 10.2 mg/dL (ref 8.6–10.3)
Chloride: 109 mmol/L (ref 98–110)
Creat: 1.02 mg/dL (ref 0.70–1.25)
GFR, Est African American: 88 mL/min/{1.73_m2} (ref >=60)
GFR, Est Non African American: 76 mL/min/{1.73_m2} (ref >=60)
Globulin: 2 g/dL (calc) (ref 1.9–3.7)
Glucose, Bld: 123 mg/dL — ABNORMAL HIGH (ref 65–99)
Potassium: 4.1 mmol/L (ref 3.5–5.3)
Sodium: 142 mmol/L (ref 135–146)
Total Bilirubin: 1 mg/dL (ref 0.2–1.2)
Total Protein: 6.3 g/dL (ref 6.1–8.1)

## 2021-02-24 LAB — HEMOGLOBIN A1C
Hgb A1c MFr Bld: 5.7 % of total Hgb — ABNORMAL HIGH (ref <5.7)
Mean Plasma Glucose: 117 mg/dL
eAG (mmol/L): 6.5 mmol/L

## 2021-02-24 LAB — LIPID PANEL
Cholesterol: 188 mg/dL (ref <200)
HDL: 43 mg/dL (ref >=40)
LDL Cholesterol (Calc): 111 mg/dL (calc) — ABNORMAL HIGH
Non-HDL Cholesterol (Calc): 145 mg/dL (calc) — ABNORMAL HIGH (ref <130)
Total CHOL/HDL Ratio: 4.4 (calc) (ref <5.0)
Triglycerides: 218 mg/dL — ABNORMAL HIGH (ref <150)

## 2021-02-24 LAB — HEPATITIS C ANTIBODY
Hepatitis C Ab: NONREACTIVE
SIGNAL TO CUT-OFF: 0.01 (ref <1.00)

## 2021-02-24 LAB — TSH: TSH: 1.37 mIU/L (ref 0.40–4.50)

## 2021-02-24 LAB — VITAMIN B12: Vitamin B-12: 621 pg/mL (ref 200–1100)

## 2021-02-24 LAB — VITAMIN D 25 HYDROXY (VIT D DEFICIENCY, FRACTURES): Vit D, 25-Hydroxy: 67 ng/mL (ref 30–100)

## 2021-02-24 LAB — PSA: PSA: 1.81 ng/mL (ref <=4.0)

## 2021-04-18 ENCOUNTER — Ambulatory Visit: Payer: Medicare HMO

## 2021-04-18 ENCOUNTER — Ambulatory Visit (INDEPENDENT_AMBULATORY_CARE_PROVIDER_SITE_OTHER): Payer: Medicare HMO | Admitting: Orthopedic Surgery

## 2021-04-18 ENCOUNTER — Encounter: Payer: Self-pay | Admitting: Orthopedic Surgery

## 2021-04-18 ENCOUNTER — Other Ambulatory Visit: Payer: Self-pay

## 2021-04-18 VITALS — BP 153/72 | HR 55 | Ht 75.0 in | Wt 208.1 lb

## 2021-04-18 DIAGNOSIS — G8929 Other chronic pain: Secondary | ICD-10-CM

## 2021-04-18 DIAGNOSIS — M11262 Other chondrocalcinosis, left knee: Secondary | ICD-10-CM | POA: Diagnosis not present

## 2021-04-18 DIAGNOSIS — M25562 Pain in left knee: Secondary | ICD-10-CM

## 2021-04-18 DIAGNOSIS — M1712 Unilateral primary osteoarthritis, left knee: Secondary | ICD-10-CM

## 2021-04-18 DIAGNOSIS — M7632 Iliotibial band syndrome, left leg: Secondary | ICD-10-CM

## 2021-04-18 NOTE — Patient Instructions (Signed)
Iliotibial Band Syndrome  Iliotibial band syndrome is a condition that often causes knee pain. It can also cause pain in the outside of the hip, thigh, and knee. The iliotibial band is a strip of tissue in each of the legs. This band runs from the outside of the hip and down the thigh to the outside of the knee. Repeatedly bending and straightening your knee can irritate your iliotibial band. What are the causes? This condition is caused by inflammation from rubbing (friction) of the iliotibial band as it moves over the thigh bone (femur) when you bend and straighten your knee again and again. What increases the risk? You are more likely to develop this condition if:  You change elevation often while running on a treadmill.  You run very long distances.  You recently increased the length or intensity of your workouts. Intensity means how much effort you put in.  You run downhill often, or you just started running downhill.  You ride a bike very far or often. You may also be at greater risk if:  You start a new workout routine without first warming up your muscles.  You have a job that requires you to bend, squat, or climb often. What are the signs or symptoms? Symptoms of this condition include:  Pain along the outside of your knee that may be worse with activity, especially running or going up and down stairs.  A feeling like a snap over your knee or hip.  Swelling on the outside of your knee.  Pain or a feeling of tightness in your hip. How is this diagnosed? This condition is diagnosed based on:  Your symptoms.  Your medical history.  A physical exam. You may also see a health care provider who specializes in reducing pain and improving movement (physical therapist). A physical therapist may do an exam to check your balance, movement, and way of walking or running (gait) to see whether the way you move could add to your injury. You may also have tests to measure your  strength, flexibility, and range of motion. How is this treated? This condition may be treated by:  Taking NSAIDs, such as ibuprofen, to help relieve pain and swelling.  Resting and limiting exercise.  Returning to activities gradually.  Doing exercises to improve movement and strength (physical therapy) as told by your health care provider.  Having an injection of steroid medicine. This is medicine that helps relieve inflammation.  Having surgery. This may be done if your symptoms do not improve after other treatments. Follow these instructions at home: Managing pain, stiffness, and swelling If directed, put ice on the injured area. To do this:  Put ice in a plastic bag.  Place a towel between your skin and the bag.  Leave the ice on for 20 minutes, 2-3 times a day.  Remove the ice if your skin turns bright red. This is very important. If you cannot feel pain, heat, or cold, you have a greater risk of damage to the area.   Activity  Return to your normal activities as told by your health care provider. Ask your health care provider what activities are safe for you.  Include low-impact activities, such as swimming, in your exercise routine.  Check with your health care provider to make sure running is safe for you.  Do exercises as told by your health care provider. General instructions  Take over-the-counter and prescription medicines only as told by your health care provider.  Make sure   you wear shoes that fit well and have good cushioning and arch support.  Keep all follow-up visits. This is important. How is this prevented?  Warm up and stretch before being active.  Cool down and stretch after being active.  Give your body time to rest between periods of activity.  Consider getting help from a coach or trainer to come up with a safe running plan and a plan to advance (progress) your training that fits your goals and ability.  Change directions often while  running around a track.  Replace your shoes when the soles are worn out.  Maintain physical fitness. This includes strength and flexibility. Contact a health care provider if:  Your pain does not improve.  Your pain gets worse even with treatment. Summary  Iliotibial band syndrome is a condition that often causes knee pain. It can also cause pain in the outside of your hip, thigh, and knee.  Treatment includes taking NSAIDs, resting, returning to activities gradually, and doing physical therapy exercises.  Return to your normal activities as told by your health care provider. Ask your health care provider what activities are safe for you. This information is not intended to replace advice given to you by your health care provider. Make sure you discuss any questions you have with your health care provider. Document Revised: 04/05/2020 Document Reviewed: 04/05/2020 Elsevier Patient Education  2021 [employer]  

## 2021-04-18 NOTE — Progress Notes (Signed)
NEW PROBLEM//OFFICE VISIT  Summary Encounter Diagnoses  Name Primary?  . Chronic pain of left knee   . Iliotibial band syndrome of left side Yes   Recommend resuming normal activity, patient education on iliotibial band syndrome  Chief Complaint  Patient presents with  . Knee Pain    L/ hurt real bad last month, but doesn't hurt now like it did. Been doing some exercises at home that seem to help some.    70 year old male presents with lateral knee pain.  We last saw him in 2019, he is therefore still a established patient.  Complains of popping in slipping sensation lateral part of his left knee  He is an avid biker and kayaker wishes to get back into those activities as the weather heats up.  He also complains of difficulty climbing the steps he says his pain is improving wants to know if he can continue with his exercise routine and get back into his biking and kayaking   Review of Systems  All other systems reviewed and are negative.    Past Medical History:  Diagnosis Date  . Gout 10/06/2019    Past Surgical History:  Procedure Laterality Date  . HEMORRHOID SURGERY N/A 07/04/2013   Procedure: HEMORRHOIDECTOMY;  Surgeon: Dalia Heading, MD;  Location: [facility];  Service: General;  Laterality: N/A;  . HERNIA REPAIR     Dr Dickey Gave  . KNEE SURGERY Left    arthroscopy  . TONSILECTOMY/ADENOIDECTOMY WITH MYRINGOTOMY      Family History  Problem Relation Age of Onset  . Cancer Mother   . Pulmonary disease Mother   . Leukemia Father    Social History   Tobacco Use  . Smoking status: Former Smoker    Packs/day: 2.00    Years: 30.00    Pack years: 60.00    Types: Cigarettes    Quit date: 06/30/2002    Years since quitting: 18.8  . Smokeless tobacco: Never Used  Vaping Use  . Vaping Use: Never used  Substance Use Topics  . Alcohol use: No    Comment: clean for 16 years  . Drug use: No    Comment: recovering addict cocaone-amphetamines-clean for 16 years     Allergies  Allergen Reactions  . Penicillins Other (See Comments)    Has patient had a PCN reaction causing immediate rash, facial/tongue/throat swelling, SOB or lightheadedness with hypotension: unknown Has patient had a PCN reaction causing severe rash involving mucus membranes or skin necrosis:unknown Has patient had a PCN reaction that required hospitalization unknown Has patient had a PCN reaction occurring within the last 10 years:unknown If all of the above answers are "NO", then may proceed with Cephalosporin use.     Current Meds  Medication Sig  . allopurinol (ZYLOPRIM) 300 MG tablet Take 1 tablet by mouth daily.  Marland Kitchen ascorbic acid (VITAMIN C) 1000 MG tablet Take 1,000 mg by mouth daily.  . Cholecalciferol (VITAMIN D3 PO) Take 10,000 Units by mouth every 3 (three) days.  . Colchicine 0.6 MG CAPS Take 1 capsule by mouth daily.  . vitamin B-12 (CYANOCOBALAMIN) 50 MCG tablet Take 50 mcg by mouth daily.    BP (!) 153/72   Pulse (!) 55   Ht 6\' 3"  (1.905 m)   Wt 208 lb 2 oz (94.4 kg)   BMI 26.01 kg/m   Physical Exam  General appearance: Well-developed well-nourished no gross deformities  Cardiovascular normal pulse and perfusion normal color without edema  Neurologically o sensation loss  or deficits or pathologic reflexes  Psychological: Awake alert and oriented x3 mood and affect normal  Skin no lacerations or ulcerations no nodularity no palpable masses, no erythema or nodularity  Musculoskeletal:  Left knee no effusion skin is normal.  Iliotibial band is tender at Gertie's tubercle and across the joint he has mild joint line tenderness seems to be at the tibial band which feels tight  Range of motion is otherwise normal ligaments are stable     MEDICAL DECISION MAKING  A.  Encounter Diagnoses  Name Primary?  . Chronic pain of left knee   . Iliotibial band syndrome of left side Yes    B. DATA ANALYSED:   IMAGING: Interpretation of images:  Internal images show chondrocalcinosis normal alignment mild subtle arthritis  Orders: None  Outside records reviewed: None   C. MANAGEMENT   Patient education resume normal activities progress with biking, kayaking, follow-up as needed  No orders of the defined types were placed in this encounter.     Fuller Canada, MD  04/18/2021 11:23 AM

## 2021-05-26 ENCOUNTER — Other Ambulatory Visit: Payer: Self-pay

## 2021-05-26 ENCOUNTER — Ambulatory Visit (INDEPENDENT_AMBULATORY_CARE_PROVIDER_SITE_OTHER): Payer: Medicare HMO | Admitting: Internal Medicine

## 2021-05-26 ENCOUNTER — Encounter (INDEPENDENT_AMBULATORY_CARE_PROVIDER_SITE_OTHER): Payer: Self-pay | Admitting: Internal Medicine

## 2021-05-26 VITALS — BP 128/64 | HR 78 | Temp 97.9°F | Resp 18 | Ht 75.0 in | Wt 207.2 lb

## 2021-05-26 DIAGNOSIS — M1A0790 Idiopathic chronic gout, unspecified ankle and foot, without tophus (tophi): Secondary | ICD-10-CM

## 2021-05-26 DIAGNOSIS — E559 Vitamin D deficiency, unspecified: Secondary | ICD-10-CM | POA: Diagnosis not present

## 2021-05-26 DIAGNOSIS — R7303 Prediabetes: Secondary | ICD-10-CM | POA: Diagnosis not present

## 2021-05-26 NOTE — Progress Notes (Signed)
Refused vaccines.

## 2021-05-26 NOTE — Progress Notes (Signed)
Metrics: Intervention Frequency ACO  Documented Smoking Status Yearly  Screened one or more times in 24 months  Cessation Counseling or  Active cessation medication Past 24 months  Past 24 months   Guideline developer: UpToDate (See UpToDate for funding source) Date Released: 2014       Wellness Office Visit  Subjective:  Patient ID: [name], male    DOB: 10-11-1951  Age: 70 y.o. MRN: [id number]  CC: This man comes in for follow-up of gout, prediabetes, hypertriglyceridemia. HPI  On the last visit, his hemoglobin A1c was 5.7% and triglycerides were elevated. He continues vitamin D3 supplementation for vitamin D deficiency. He realizes that he eats sugary foods.  He does not drink sodas. Past Medical History:  Diagnosis Date   Gout 10/06/2019   Past Surgical History:  Procedure Laterality Date   HEMORRHOID SURGERY N/A 07/04/2013   Procedure: HEMORRHOIDECTOMY;  Surgeon: Dalia Heading, MD;  Location: [facility];  Service: General;  Laterality: N/A;   HERNIA REPAIR     Dr Dickey Gave   KNEE SURGERY Left    arthroscopy   TONSILECTOMY/ADENOIDECTOMY WITH MYRINGOTOMY       Family History  Problem Relation Age of Onset   Cancer Mother    Pulmonary disease Mother    Leukemia Father     Social History   Social History Narrative   Divorced for 13 years,married for 12 years.Lives alone.Retired.Previously [employer] family business.   Social History   Tobacco Use   Smoking status: Former    Packs/day: 2.00    Years: 30.00    Pack years: 60.00    Types: Cigarettes    Quit date: 06/30/2002    Years since quitting: 18.9   Smokeless tobacco: Never  Substance Use Topics   Alcohol use: No    Comment: clean for 16 years    Current Meds  Medication Sig   allopurinol (ZYLOPRIM) 300 MG tablet Take 1 tablet by mouth daily.   ascorbic acid (VITAMIN C) 1000 MG tablet Take 1,000 mg by mouth daily.   Cholecalciferol (VITAMIN D3 PO) Take 10,000 Units by mouth every 3  (three) days.   Colchicine 0.6 MG CAPS Take 1 capsule by mouth daily.   Turmeric Curcumin 500 MG CAPS Take 1 capsule by mouth daily.   vitamin B-12 (CYANOCOBALAMIN) 50 MCG tablet Take 50 mcg by mouth daily.     Flowsheet Row Office Visit from 02/23/2021 in [facility]  PHQ-9 Total Score 0       Objective:   Today's Vitals: BP 128/64 (BP Location: Right Arm, Patient Position: Sitting, Cuff Size: Normal)   Pulse 78   Temp 97.9 F (36.6 C) (Temporal)   Resp 18   Ht 6\' 3"  (1.905 m)   Wt 207 lb 3.2 oz (94 kg)   SpO2 98%   BMI 25.90 kg/m  Vitals with BMI 05/26/2021 04/18/2021 02/23/2021  Height 6\' 3"  6\' 3"  6\' 3"   Weight 207 lbs 3 oz 208 lbs 2 oz 212 lbs 3 oz  BMI 25.9 26.01 26.52  Systolic 128 153 04/25/2021  Diastolic 64 72 80  Pulse 78 55 49     Physical Exam  He looks systemically well.  Blood pressure is in a good range.     Assessment   1. Vitamin D deficiency disease   2. Chronic gout of foot, unspecified cause, unspecified laterality   3. Prediabetes       Tests ordered No orders of the defined types  were placed in this encounter.    Plan: Continue with vitamin D3 supplementation for vitamin D deficiency. Continue with allopurinol for gout prevention. Focus on nutrition and avoid sugary foods. Follow-up with Maralyn Sago in about 3 months time and I would recommend we repeat the A1c at that time.      No orders of the defined types were placed in this encounter.   Wilson Singer, MD

## 2021-08-25 ENCOUNTER — Ambulatory Visit (INDEPENDENT_AMBULATORY_CARE_PROVIDER_SITE_OTHER): Payer: Medicare HMO | Admitting: Nurse Practitioner

## 2021-10-27 ENCOUNTER — Encounter: Payer: Self-pay | Admitting: Urology

## 2021-10-27 ENCOUNTER — Other Ambulatory Visit: Payer: Self-pay

## 2021-10-27 ENCOUNTER — Ambulatory Visit: Payer: Medicare HMO | Admitting: Urology

## 2021-10-27 VITALS — BP 158/71 | HR 57 | Temp 97.8°F

## 2021-10-27 DIAGNOSIS — Z3009 Encounter for other general counseling and advice on contraception: Secondary | ICD-10-CM | POA: Diagnosis not present

## 2021-10-27 NOTE — Patient Instructions (Signed)
Taking care of yourself after a VASECTOMY ? ?                                            Patient Information Sheet ? ?      ?The following information will reinforce some of the instructions that your doctor has given you. ? ?Day of Procedure: ?1) Wear the scrotal supporter and gauze pad ?2) Use an ice pack on the scrotum for 15 minutes every hour for 48 hours to help reduce discomfort, swelling and bruising (do NOT place ice directly on your skin, but place on top of the supporter) ?3) Expect some clear to pinkish drainage at the surgical site for the first 24-48 hours ?4) If needed, use pain medications provided or ibuprofen 800 mg every 8 hours for discomfort ?5) Avoid strenuous activities like mowing, lifting, jogging and exercising for 1 week.  Take it easy! ?6) If you develop a fever over 101 F or sudden onset of significant swelling within the first 12 hours, please call to report this to your doctor as soon as possible.   ? ? ?Day Two and Three: ?1) You may take a shower, but avoid tubs, pools or hot tubs. ?2) Continue to wear the scrotal supporter as needed for comfort and change or remove the gauze pad if desired ?3) Keep taking it easy!  Avoid strenuous activities like mowing, lifting, jogging and exercising.   ?4) Continue to watch for signs or symptoms of fever or significant swelling ?5) Apply a small amount of antibiotic ointment to incision 1-2 times/day ? ?The rest of the week: ?1) Gradually return to normal physical activities after one week.  A return of soreness might mean you are  ?      ?doing too much too soon?. ?2) Avoid sexual activity for 10 days after the procedure ?3) Continue to take a shower, but avoid tubs, pools or hot tubs ?4) Wearing the scrotal supporter is optional based on your comfort.   ? ? ?Remember to use an alternate form of contraception for 3 months until you have been checked and CLEARED by your urologist! ? ?3-Month Urology Clinic: ? 1) The urologist will need to look at  a semen sample under a microscope ? 2) Use the specimen cup provided to collect the sample AT HOME 1 hour before the appointment ? 3) DO NOT refrigerate the specimen, but keep at room or body temperature ? 4) Avoid ejaculation for 2-5 days before collecting the specimen ? 5) Collect the entire specimen by masturbation using NO lubricant ? 6) Make sure your name, MR number, date and time of collection are on the cup  ?

## 2021-10-27 NOTE — Progress Notes (Signed)
Assessment: 1. Encounter for vasectomy counseling     Plan: Patient will contact the office if he is interested in proceeding with vasectomy. Will send in prescription for alprazolam to take prior to procedure.  Chief Complaint:  Chief Complaint  Patient presents with   VAS Consult    History of Present Illness:  [name] is a 70 y.o. year old male who is seen for vasectomy evaluation.  He is married with no children.  His wife is 35.  No children with prior partners.  No history of scrotal trauma or infection.   Past Medical History:  Past Medical History:  Diagnosis Date   Gout 10/06/2019    Past Surgical History:  Past Surgical History:  Procedure Laterality Date   HEMORRHOID SURGERY N/A 07/04/2013   Procedure: HEMORRHOIDECTOMY;  Surgeon: Dalia Heading, MD;  Location: [facility];  Service: General;  Laterality: N/A;   HERNIA REPAIR     Dr Dickey Gave   KNEE SURGERY Left    arthroscopy   TONSILECTOMY/ADENOIDECTOMY WITH MYRINGOTOMY      Allergies:  Allergies  Allergen Reactions   Penicillin G Benzathine Other (See Comments)   Penicillins Other (See Comments)    Has patient had a PCN reaction causing immediate rash, facial/tongue/throat swelling, SOB or lightheadedness with hypotension: unknown Has patient had a PCN reaction causing severe rash involving mucus membranes or skin necrosis:unknown Has patient had a PCN reaction that required hospitalization unknown Has patient had a PCN reaction occurring within the last 10 years:unknown If all of the above answers are "NO", then may proceed with Cephalosporin use.     Family History:  Family History  Problem Relation Age of Onset   Cancer Mother    Pulmonary disease Mother    Leukemia Father     Social History:  Social History   Tobacco Use   Smoking status: Former    Packs/day: 2.00    Years: 30.00    Pack years: 60.00    Types: Cigarettes    Quit date: 06/30/2002    Years since quitting: 19.3    Smokeless tobacco: Never  Vaping Use   Vaping Use: Never used  Substance Use Topics   Alcohol use: No    Comment: clean for 16 years   Drug use: No    Comment: recovering addict cocaone-amphetamines-clean for 16 years    Review of symptoms:  Constitutional:  Negative for unexplained weight loss, night sweats, fever, chills ENT:  Negative for nose bleeds, sinus pain, painful swallowing CV:  Negative for chest pain, shortness of breath, exercise intolerance, palpitations, loss of consciousness Resp:  Negative for cough, wheezing, shortness of breath GI:  Negative for nausea, vomiting, diarrhea, bloody stools GU:  Positives noted in HPI; otherwise negative for gross hematuria, dysuria, urinary incontinence Neuro:  Negative for seizures, poor balance, limb weakness, slurred speech Psych:  Negative for lack of energy, depression, anxiety Endocrine:  Negative for polydipsia, polyuria, symptoms of hypoglycemia (dizziness, hunger, sweating) Hematologic:  Negative for anemia, purpura, petechia, prolonged or excessive bleeding, use of anticoagulants  Allergic:  Negative for difficulty breathing or choking as a result of exposure to anything; no shellfish allergy; no allergic response (rash/itch) to materials, foods  Physical exam: BP (!) 158/71   Pulse (!) 57   Temp 97.8 F (36.6 C)  GENERAL APPEARANCE:  Well appearing, well developed, well nourished, NAD HEENT:  Atraumatic, normocephalic, oropharynx clear NECK:  Supple without lymphadenopathy or thyromegaly ABDOMEN:  Soft, non-tender, no masses  EXTREMITIES:  Moves all extremities well, without clubbing, cyanosis, or edema NEUROLOGIC:  Alert and oriented x 3, normal gait, CN II-XII grossly intact MENTAL STATUS:  appropriate BACK:  Non-tender to palpation, No CVAT SKIN:  Warm, dry, and intact GU: Penis:  circumcised Meatus: Normal Scrotum: Vas palpated bilaterally Testis: normal without masses bilateral Epididymis: normal     Results: None  VASECTOMY CONSULTATION  PAULANTHONY Liu presents for vasectomy consultation today.  He is a 70 y.o. male, Married with no  children . He and his wife have discussed the issues regarding long-term fertility and are comfortable with this decision.  He presents for consideration for vasectomy.  I discussed the issues in detail with him today and he expressed no reservations.  As to the procedure, no scalpel technique vasectomy is explained and reviewed in detail.  Generalized risks including but not limited to bleeding, infection, orchalgia, testicular atrophy, epididymitis, scrotal hematoma, and chronic pain are discussed.   Additionally, he understands that the possibility of vas recanalization following vasectomy is possible although rare.  Most importantly, the patient understands that he is not sterile initially and will need a semen analysis check to confirm sterility such that no sperm are seen.  He is advised to avoid ejaculation for 10 days following the procedure.  The initial semen analysis will be checked in approximately 12 weeks and in some patients, several months may be required for clearance of all sperm.  He reports a clear understanding of the need for continued birth control until sterility is confirmed.  Otherwise, general issues regarding local anesthesia, prep, alprazolam are discussed and he reports a clear understanding.

## 2021-10-27 NOTE — Progress Notes (Signed)
Urological Symptom Review  Patient is experiencing the following symptoms: Leakage of urine   Review of Systems  Gastrointestinal (upper)  : Negative for upper GI symptoms  Gastrointestinal (lower) : Negative for lower GI symptoms  Constitutional : Negative for symptoms  Skin: Negative for skin symptoms  Eyes: Negative for eye symptoms  Ear/Nose/Throat : Negative for Ear/Nose/Throat symptoms  Hematologic/Lymphatic: Negative for Hematologic/Lymphatic symptoms  Cardiovascular : Negative for cardiovascular symptoms  Respiratory : Negative for respiratory symptoms  Endocrine: Negative for endocrine symptoms  Musculoskeletal: Negative for musculoskeletal symptoms  Neurological: Negative for neurological symptoms  Psychologic: Negative for psychiatric symptoms  

## 2021-11-28 ENCOUNTER — Ambulatory Visit: Payer: Medicare HMO | Admitting: Urology

## 2022-03-06 ENCOUNTER — Telehealth: Payer: Self-pay

## 2022-03-06 NOTE — Telephone Encounter (Signed)
Please see patient request.

## 2022-03-06 NOTE — Telephone Encounter (Signed)
Patient called and  wanted medication called in for Vasectomy appt this week. He advised for medication to be called into [facility].  ? ?Than you!  ?

## 2022-03-07 ENCOUNTER — Other Ambulatory Visit: Payer: Self-pay | Admitting: Urology

## 2022-03-07 DIAGNOSIS — Z3009 Encounter for other general counseling and advice on contraception: Secondary | ICD-10-CM

## 2022-03-07 MED ORDER — ALPRAZOLAM 1 MG PO TABS: ORAL_TABLET | ORAL | 0 refills | Status: AC

## 2022-03-09 ENCOUNTER — Encounter: Payer: Self-pay | Admitting: Urology

## 2022-03-09 ENCOUNTER — Ambulatory Visit: Payer: Medicare HMO | Admitting: Urology

## 2022-03-09 ENCOUNTER — Other Ambulatory Visit: Payer: Self-pay

## 2022-03-09 DIAGNOSIS — Z302 Encounter for sterilization: Secondary | ICD-10-CM | POA: Diagnosis not present

## 2022-03-09 MED ORDER — LEVOFLOXACIN 500 MG PO TABS: 500.0000 mg | ORAL_TABLET | Freq: Every day | ORAL | 0 refills | Status: AC

## 2022-03-09 MED ORDER — HYDROCODONE-ACETAMINOPHEN 5-325 MG PO TABS: 1.0000 | ORAL_TABLET | Freq: Four times a day (QID) | ORAL | 0 refills | Status: AC | PRN

## 2022-03-09 NOTE — Patient Instructions (Signed)
Vasectomy Postoperative Instructions  Please bring back a semen analysis in approximately 3 months.  Your semen analysis  will need to be taken to  [facility] [address] [place], [place] [address] There phone number is [phone]. Please call and schedule an appointment before taking your specimen.    You will be given a sterile specimen cup. Please label the cup with your name, date of birth, date and time of collections.  What to Expect  - slight redness, swelling and scant drainage along the incision  - mild to moderate discomfort  - black and blue (bruising) as the tissue heals  - low grade fever  - scrotal sensitivity and/or tenderness - Edges of the incision may pull apart and heal slowly, sometimes a knot may be present which remains for several months.  This is NORMAL and all part of the healing process. - if stitches are placed, they do not need to be removed - if you have pain or discomfort immediately after the vasectomy, you may use OTC pain medication for relief , ex: tylenol.  After local anesthetic wears off an ice pack will provide additional comfort and can also prevent swelling if used  Activity  - no sexual intercourse for at lease 5 days depending on comfort  - no heavy lifting for 48-72 hours (anything over 5-10 lbs)  Wound Care  - shower only after 24 hours  - no tub baths, hot tub, or pools for at least 7 days  - ice packs for 48 hours: 30 minutes on and 30 minutes off  Problem to Report  - generalized redness  - increased pain and swelling  - fever greater than 101 F  - significant drainage or bleeding from the wound  TO DO - Ejaculations help to clear the passage of sperm, but you must use another from of birth control until you are told you may discontinue its use!! - You will be given a specimen cup to bring back a semen sample in 3 months to check and see if its clear of sperm.  Only after the semen is sent for analysis and is reported back as clear  should you use this as your primary form of birth control!   

## 2022-03-09 NOTE — Progress Notes (Signed)
? ?Assessment: ?1. Encounter for vasectomy   ? ? ?Plan: ?Postvasectomy instructions given. ?Prescription for antibiotics and pain medication provided. ?Postvasectomy semen analysis in 3 months. ? ?Chief Complaint:  ?Chief Complaint  ?Patient presents with  ? VAS  ? ? ?History of Present Illness: ? ?[name] is a 71 y.o. year old male who is seen for vasectomy.  He is married with no children.  His wife is 8.  No children with prior partners.  No history of scrotal trauma or infection. ? ? ?Past Medical History:  ?Past Medical History:  ?Diagnosis Date  ? Gout 10/06/2019  ? ? ?Past Surgical History:  ?Past Surgical History:  ?Procedure Laterality Date  ? HEMORRHOID SURGERY N/A 07/04/2013  ? Procedure: HEMORRHOIDECTOMY;  Surgeon: Jamesetta So, MD;  Location: [facility];  Service: General;  Laterality: N/A;  ? HERNIA REPAIR    ? Dr Leda Roys  ? KNEE SURGERY Left   ? arthroscopy  ? TONSILECTOMY/ADENOIDECTOMY WITH MYRINGOTOMY    ? ? ?Allergies:  ?Allergies  ?Allergen Reactions  ? Penicillin G Benzathine Other (See Comments)  ? Penicillins Other (See Comments)  ?  Has patient had a PCN reaction causing immediate rash, facial/tongue/throat swelling, SOB or lightheadedness with hypotension: unknown ?Has patient had a PCN reaction causing severe rash involving mucus membranes or skin necrosis:unknown ?Has patient had a PCN reaction that required hospitalization unknown ?Has patient had a PCN reaction occurring within the last 10 years:unknown ?If all of the above answers are "NO", then may proceed with Cephalosporin use. ?  ? ? ?Family History:  ?Family History  ?Problem Relation Age of Onset  ? Cancer Mother   ? Pulmonary disease Mother   ? Leukemia Father   ? ? ?Social History:  ?Social History  ? ?Tobacco Use  ? Smoking status: Former  ?  Packs/day: 2.00  ?  Years: 30.00  ?  Pack years: 60.00  ?  Types: Cigarettes  ?  Quit date: 06/30/2002  ?  Years since quitting: 19.7  ? Smokeless tobacco: Never  ?Vaping Use  ? Vaping  Use: Never used  ?Substance Use Topics  ? Alcohol use: No  ?  Comment: clean for 16 years  ? Drug use: No  ?  Comment: recovering addict cocaone-amphetamines-clean for 16 years  ? ? ?ROS: ?Constitutional:  Negative for fever, chills, weight loss ?CV: Negative for chest pain, previous MI, hypertension ?Respiratory:  Negative for shortness of breath, wheezing, sleep apnea, frequent cough ?GI:  Negative for nausea, vomiting, bloody stool, GERD ? ?Physical exam: ?GENERAL APPEARANCE:  Well appearing, well developed, well nourished, NAD ?HEENT:  Atraumatic, normocephalic, oropharynx clear ?NECK:  Supple without lymphadenopathy or thyromegaly ?ABDOMEN:  Soft, non-tender, no masses ?EXTREMITIES:  Moves all extremities well, without clubbing, cyanosis, or edema ?NEUROLOGIC:  Alert and oriented x 3, normal gait, CN II-XII grossly intact ?MENTAL STATUS:  appropriate ?BACK:  Non-tender to palpation, No CVAT ?SKIN:  Warm, dry, and intact ?  ? ?Results: ?None ? ?VASECTOMY PROCEDURE: ? ?[name] presents for vasectomy following previous vasectomy consultation and permit is signed.  The patient's anterior scrotal wall is shaved and prepped with Betadine in standard sterile fashion.  1% lidocaine is used as local anesthetic in the scrotal and peri vasal tissue.  ?A standard median raphe punch incision is made and a no scalpel technique vasectomy is performed.  Bilateral vas are isolated from the peri vasal tissue and an approximately 1 cm segment of vas is excised.  Proximal and distal segments are internally cauterized with electric heat cautery. Interposition of perivasal tissue was performed.  Bilateral palpation confirms bilateral vasectomy defect and no significant bleeding or hematoma is identified.  Neosporin gauze dressing and a scrotal support are applied.  ?  ?Disposition: Patient is discharged home with Rx for pain medication and antibiotics.  Patient is given routine vasectomy instructions.   Most importantly, he is  instructed and cautioned again regarding the need for protected intercourse until such time that a single  negative semen analysis has been obtained.  The initial semen analysis will be checked in approximately 12  weeks.  The patient reports a clear understanding.  He will call with any interval questions or concerns. ? ?

## 2022-03-10 ENCOUNTER — Telehealth: Payer: Self-pay

## 2022-03-10 NOTE — Telephone Encounter (Signed)
Pt left a voicemail on 03-10-22. ? ?Wants to know when he can take bandages off from vasectomy done on Thurs. ? ?Please advise. ? ?Call back:  [phone] ? ?Thanks, ?Rosey Bath ?

## 2022-03-13 ENCOUNTER — Telehealth: Payer: Self-pay

## 2022-03-13 NOTE — Telephone Encounter (Signed)
Followed up with patient regarding previous phone call.  Patient aware ok to remove bandages.  ?

## 2022-06-13 LAB — POST-VAS SPERM EVALUATION,QUAL: Volume: 2.2 mL

## 2022-06-14 ENCOUNTER — Other Ambulatory Visit: Payer: Self-pay

## 2022-06-14 DIAGNOSIS — Z9852 Vasectomy status: Secondary | ICD-10-CM

## 2022-07-26 LAB — POST-VAS SPERM EVALUATION,QUAL: Volume: 1 mL

## 2022-07-27 ENCOUNTER — Telehealth: Payer: Self-pay

## 2022-07-27 ENCOUNTER — Encounter: Payer: Self-pay | Admitting: Urology

## 2022-07-27 NOTE — Telephone Encounter (Signed)
Made patient aware and patient voiced understanding. Schedule patient follow up appt 08/22

## 2022-08-08 ENCOUNTER — Encounter: Payer: Self-pay | Admitting: Urology

## 2022-08-08 ENCOUNTER — Ambulatory Visit: Payer: Medicare HMO | Admitting: Urology

## 2022-08-08 VITALS — BP 134/73 | HR 66 | Ht 76.0 in | Wt 197.0 lb

## 2022-08-08 DIAGNOSIS — Z308 Encounter for other contraceptive management: Secondary | ICD-10-CM

## 2022-08-08 DIAGNOSIS — Z9852 Vasectomy status: Secondary | ICD-10-CM | POA: Diagnosis not present

## 2022-08-08 NOTE — Patient Instructions (Signed)
Please call [phone] to schedule a lab visit with [facility].  Specimen will have to be collected within an hour of apt.  More instructions will be given by Judeth Cornfield at [facility].

## 2022-08-08 NOTE — Progress Notes (Signed)
   Assessment: 1. Status post vasectomy     Plan: I discussed the potential significance of the presence of sperm on post-vasectomy semen analysis. Recommend repeating a formal semen analysis (count, motility, etc) Will call with results Continue birth control at this time  Chief Complaint:  Chief Complaint  Patient presents with   Vasectomy follow-up    History of Present Illness:  [name] is a 71 y.o. year old male who is seen for follow-up after a vasectomy.  He is married with no children.  His wife is 35.   He underwent a routine vasectomy in March 2023.  No complications following procedure. Postvasectomy semen analysis from 6/23 and 8/23 showed presence of sperm. No scrotal pain.  He continues to use birth control.  Past Medical History:  Past Medical History:  Diagnosis Date   Gout 10/06/2019    Past Surgical History:  Past Surgical History:  Procedure Laterality Date   HEMORRHOID SURGERY N/A 07/04/2013   Procedure: HEMORRHOIDECTOMY;  Surgeon: Dalia Heading, MD;  Location: [facility];  Service: General;  Laterality: N/A;   HERNIA REPAIR     Dr Dickey Gave   KNEE SURGERY Left    arthroscopy   TONSILECTOMY/ADENOIDECTOMY WITH MYRINGOTOMY      Allergies:  Allergies  Allergen Reactions   Penicillin G Benzathine Other (See Comments)   Penicillins Other (See Comments)    Has patient had a PCN reaction causing immediate rash, facial/tongue/throat swelling, SOB or lightheadedness with hypotension: unknown Has patient had a PCN reaction causing severe rash involving mucus membranes or skin necrosis:unknown Has patient had a PCN reaction that required hospitalization unknown Has patient had a PCN reaction occurring within the last 10 years:unknown If all of the above answers are "NO", then may proceed with Cephalosporin use.     Family History:  Family History  Problem Relation Age of Onset   Cancer Mother    Pulmonary disease Mother    Leukemia Father      Social History:  Social History   Tobacco Use   Smoking status: Former    Packs/day: 2.00    Years: 30.00    Total pack years: 60.00    Types: Cigarettes    Quit date: 06/30/2002    Years since quitting: 20.1   Smokeless tobacco: Never  Vaping Use   Vaping Use: Never used  Substance Use Topics   Alcohol use: No    Comment: clean for 16 years   Drug use: No    Comment: recovering addict cocaone-amphetamines-clean for 16 years    ROS: Constitutional:  Negative for fever, chills, weight loss CV: Negative for chest pain, previous MI, hypertension Respiratory:  Negative for shortness of breath, wheezing, sleep apnea, frequent cough GI:  Negative for nausea, vomiting, bloody stool, GERD  Physical exam: GENERAL APPEARANCE:  Well appearing, well developed, well nourished, NAD HEENT:  Atraumatic, normocephalic, oropharynx clear NECK:  Supple without lymphadenopathy or thyromegaly ABDOMEN:  Soft, non-tender, no masses EXTREMITIES:  Moves all extremities well, without clubbing, cyanosis, or edema NEUROLOGIC:  Alert and oriented x 3, normal gait, CN II-XII grossly intact MENTAL STATUS:  appropriate BACK:  Non-tender to palpation, No CVAT SKIN:  Warm, dry, and intact GU: Scrotum: no swelling or erythema; vas site palpated bilaterally Testis: normal without masses bilateral Epididymis: normal    Results: None

## 2022-09-11 ENCOUNTER — Telehealth: Payer: Self-pay

## 2022-09-11 DIAGNOSIS — Z9852 Vasectomy status: Secondary | ICD-10-CM

## 2022-09-11 NOTE — Telephone Encounter (Signed)
Patient came into office today to ask to repeat the post vas analysis that he previously done that reports sperm present.   Patient reports he is not able to make it to [facility] to complete timed semen analysis test.

## 2022-09-12 NOTE — Telephone Encounter (Signed)
I called and spoke with patient. Lab order sent to [facility].

## 2022-09-15 LAB — POST-VAS SPERM EVALUATION,QUAL: Volume: 2.4 mL

## 2022-09-22 LAB — POST VAS SEMEN ANALYSIS, AUA: Volume: 2 mL

## 2022-10-19 ENCOUNTER — Other Ambulatory Visit: Payer: Self-pay | Admitting: Urology

## 2022-10-19 DIAGNOSIS — Z9852 Vasectomy status: Secondary | ICD-10-CM

## 2022-10-23 ENCOUNTER — Telehealth: Payer: Self-pay

## 2022-10-23 NOTE — Telephone Encounter (Signed)
I wanted to confirm which Endocrinologist you wanted patient to be referred to. I wanted to ensure I send the referral to the correct location.

## 2022-12-22 ENCOUNTER — Encounter: Payer: Self-pay | Admitting: Urology

## 2023-01-08 ENCOUNTER — Ambulatory Visit
Admission: EM | Admit: 2023-01-08 | Discharge: 2023-01-08 | Disposition: A | Discharge status: Home / Self Care | Payer: Medicare HMO | Attending: Family Medicine | Admitting: Family Medicine

## 2023-01-08 DIAGNOSIS — R300 Dysuria: Secondary | ICD-10-CM | POA: Diagnosis not present

## 2023-01-08 DIAGNOSIS — R509 Fever, unspecified: Secondary | ICD-10-CM | POA: Diagnosis not present

## 2023-01-08 DIAGNOSIS — R350 Frequency of micturition: Secondary | ICD-10-CM | POA: Diagnosis not present

## 2023-01-08 DIAGNOSIS — Z113 Encounter for screening for infections with a predominantly sexual mode of transmission: Secondary | ICD-10-CM | POA: Insufficient documentation

## 2023-01-08 DIAGNOSIS — N390 Urinary tract infection, site not specified: Secondary | ICD-10-CM | POA: Diagnosis not present

## 2023-01-08 LAB — POCT URINALYSIS DIP (MANUAL ENTRY)
Bilirubin, UA: NEGATIVE
Glucose, UA: NEGATIVE mg/dL
Nitrite, UA: NEGATIVE
Protein Ur, POC: NEGATIVE mg/dL
Spec Grav, UA: <=1.005 — AB (ref 1.010–1.025)
Urobilinogen, UA: 0.2 E.U./dL
pH, UA: 5.5 (ref 5.0–8.0)

## 2023-01-08 MED ORDER — SULFAMETHOXAZOLE-TRIMETHOPRIM 800-160 MG PO TABS: 1.0000 | ORAL_TABLET | Freq: Two times a day (BID) | ORAL | 0 refills | Status: AC

## 2023-01-08 NOTE — ED Triage Notes (Signed)
Patient states he was having burning when urinate, pain in lower back and abdomen, that started a week ago. Did have a fever wednesday through Friday 101. Taking tylenol.

## 2023-01-08 NOTE — ED Provider Notes (Signed)
[facility]    CSN: [id number] Arrival date & time: 01/08/23  1110      History   Chief Complaint Chief Complaint  Patient presents with   Urinary Frequency    HPI [name] is a 72 y.o. male.   Patient presenting today with about a week of dysuria, lower back aching.  Denies hematuria, penile discharge, rashes, lesions, fever, chills, body aches.  Denies significant concern for STDs.  Taking Tylenol with minimal relief.  No past history of similar issues.    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   Acquired trigger finger 08/20/2013   Osteoarthritis of left knee 11/06/2012   Lateral meniscus, posterior horn derangement 10/24/2012   Knee pain 10/24/2012   TENDINITIS, LEFT KNEE 09/22/2009    Past Surgical History:  Procedure Laterality Date   HEMORRHOID SURGERY N/A 07/04/2013   Procedure: HEMORRHOIDECTOMY;  Surgeon: Jamesetta So, MD;  Location: [facility];  Service: General;  Laterality: N/A;   HERNIA REPAIR     Dr Leda Roys   KNEE SURGERY Left    arthroscopy   TONSILECTOMY/ADENOIDECTOMY WITH MYRINGOTOMY         Home Medications    Prior to Admission medications   Medication Sig Start Date End Date Taking? Authorizing Provider  allopurinol (ZYLOPRIM) 300 MG tablet Take 1 tablet by mouth daily. 07/14/19  Yes [provider]  ascorbic acid (VITAMIN C) 1000 MG tablet Take 1,000 mg by mouth daily.   Yes [provider]  Cholecalciferol (VITAMIN D3 PO) Take 10,000 Units by mouth every 3 (three) days.   Yes [provider]  sulfamethoxazole-trimethoprim (BACTRIM DS) 800-160 MG tablet Take 1 tablet by mouth 2 (two) times daily for 7 days. 01/08/23 01/15/23 Yes Volney American, PA-C  Turmeric Curcumin 500 MG CAPS Take 1 capsule by mouth daily.   Yes [provider]  vitamin B-12 (CYANOCOBALAMIN) 50 MCG  tablet Take 50 mcg by mouth daily.   Yes [provider]  ALPRAZolam Duanne Moron) 1 MG tablet Take 1 tablet by mouth 1 hour prior to procedure 03/07/22   Stoneking, Reece Leader., MD  Colchicine 0.6 MG CAPS Take 1 capsule by mouth daily. 09/18/19   [provider]  HYDROcodone-acetaminophen (NORCO/VICODIN) 5-325 MG tablet Take 1 tablet by mouth every 6 (six) hours as needed for moderate pain. 03/09/22   Stoneking, Reece Leader., MD    Family History Family History  Problem Relation Age of Onset   Cancer Mother    Pulmonary disease Mother    Leukemia Father     Social History Social History   Tobacco Use   Smoking status: Former    Packs/day: 2.00    Years: 30.00    Total pack years: 60.00    Types: Cigarettes    Quit date: 06/30/2002    Years since quitting: 20.5   Smokeless tobacco: Never  Vaping Use   Vaping Use: Never used  Substance Use Topics   Alcohol use: No    Comment: clean for 16 years   Drug use: No    Comment: recovering addict cocaone-amphetamines-clean for 16 years     Allergies   Penicillin g benzathine and Penicillins   Review of Systems Review of Systems PER HPI  Physical Exam Triage Vital Signs ED Triage Vitals [01/08/23 1150]  Enc Vitals Group  BP (!) 146/88     Pulse Rate (!) 52     Resp 18     Temp 98.4 F (36.9 C)     Temp Source Oral     SpO2 98 %     Weight      Height      Head Circumference      Peak Flow      Pain Score 0     Pain Loc      Pain Edu?      Excl. in GC?    No data found.  Updated Vital Signs BP (!) 146/88 (BP Location: Right Arm)   Pulse (!) 52   Temp 98.4 F (36.9 C) (Oral)   Resp 18   SpO2 98%   Visual Acuity Right Eye Distance:   Left Eye Distance:   Bilateral Distance:    Right Eye Near:   Left Eye Near:    Bilateral Near:     Physical Exam Vitals and nursing note reviewed.  Constitutional:      Appearance: Normal appearance.  HENT:     Head: Atraumatic.  Eyes:     Extraocular  Movements: Extraocular movements intact.     Conjunctiva/sclera: Conjunctivae normal.  Cardiovascular:     Rate and Rhythm: Normal rate and regular rhythm.  Pulmonary:     Effort: Pulmonary effort is normal.     Breath sounds: Normal breath sounds.  Abdominal:     General: Bowel sounds are normal. There is no distension.     Palpations: Abdomen is soft.     Tenderness: There is no abdominal tenderness. There is no guarding.  Musculoskeletal:        General: Normal range of motion.     Cervical back: Normal range of motion and neck supple.  Skin:    General: Skin is warm and dry.  Neurological:     General: No focal deficit present.     Mental Status: He is oriented to person, place, and time.  Psychiatric:        Mood and Affect: Mood normal.        Thought Content: Thought content normal.        Judgment: Judgment normal.      UC Treatments / Results  Labs (all labs ordered are listed, but only abnormal results are displayed) Labs Reviewed  POCT URINALYSIS DIP (MANUAL ENTRY) - Abnormal; Notable for the following components:      Result Value   Color, UA light yellow (*)    Clarity, UA cloudy (*)    Ketones, POC UA trace (5) (*)    Spec Grav, UA <=1.005 (*)    Blood, UA trace-intact (*)    Leukocytes, UA Large (3+) (*)    All other components within normal limits  URINE CULTURE  CYTOLOGY, (ORAL, ANAL, URETHRAL) ANCILLARY ONLY    EKG   Radiology No results found.  Procedures Procedures (including critical care time)  Medications Ordered in UC Medications - No data to display  Initial Impression / Assessment and Plan / UC Course  I have reviewed the triage vital signs and the nursing notes.  Pertinent labs & imaging results that were available during my care of the patient were reviewed by me and considered in my medical decision making (see chart for details).     Urinalysis with evidence of a possible urinary tract infection, treat with Bactrim while  awaiting cytology swab for further rule out.  Urine culture pending  as well.  Discussed supportive home care and return precautions.  Final Clinical Impressions(s) / UC Diagnoses   Final diagnoses:  Dysuria     Discharge Instructions      Your urine test today shows a possible urinary tract infection, so we will place you on a course of antibiotics for this while awaiting the remainder of your results to further clarify exactly what is going on.  If your remaining results tell us that we need to change course for any reason, someone will give you a call and discuss these changes.  You may take Azo over-the-counter as needed for the burning, drink plenty of fluids.  Follow-up if significantly worsening    ED Prescriptions     Medication Sig Dispense Auth. Provider   sulfamethoxazole-trimethoprim (BACTRIM DS) 800-160 MG tablet Take 1 tablet by mouth 2 (two) times daily for 7 days. 14 tablet Volney American, [place]      PDMP not reviewed this encounter.   Volney American, [place] 01/08/23 1232

## 2023-01-08 NOTE — Discharge Instructions (Signed)
Your urine test today shows a possible urinary tract infection, so we will place you on a course of antibiotics for this while awaiting the remainder of your results to further clarify exactly what is going on.  If your remaining results tell us that we need to change course for any reason, someone will give you a call and discuss these changes.  You may take Azo over-the-counter as needed for the burning, drink plenty of fluids.  Follow-up if significantly worsening

## 2023-01-09 LAB — CYTOLOGY, (ORAL, ANAL, URETHRAL) ANCILLARY ONLY
Chlamydia: NEGATIVE
Comment: NEGATIVE
Comment: NEGATIVE
Comment: NORMAL
Neisseria Gonorrhea: NEGATIVE
Trichomonas: NEGATIVE

## 2023-01-11 LAB — URINE CULTURE: Culture: >=100000 — AB

## 2023-02-15 ENCOUNTER — Encounter: Payer: Self-pay | Admitting: Radiology

## 2023-03-30 LAB — EXTERNAL GENERIC LAB PROCEDURE: COLOGUARD: NEGATIVE

## 2023-03-30 LAB — COLOGUARD: COLOGUARD: NEGATIVE

## 2023-04-19 ENCOUNTER — Encounter: Payer: Self-pay | Admitting: Urology

## 2023-04-19 ENCOUNTER — Telehealth: Payer: Self-pay | Admitting: Urology

## 2023-04-19 NOTE — Telephone Encounter (Signed)
Patient called and stated Dr Pete Glatter has referred him to [facility] before and he would like Dr Pete Glatter to refer him back there for a semen analysis again.   Patients callback #: [phone]

## 2023-04-20 NOTE — Telephone Encounter (Signed)
Patient called in states that he was a patient of Dr. Pete Glatter. Patient states that Dr. Pete Glatter refer him to [facility] in [place] evaluation with semen analysis. Patient states that he need another referral to [facility] in [place] evaluation with semen analysis. Patient is aware to [place] and patient is aware that a message will be sent to the MD.      Call back number [phone]

## 2023-04-23 NOTE — Telephone Encounter (Signed)
Per pt, the last analysis he had showed a few sperm  and he wants to be absolutely sure he is good.

## 2023-04-23 NOTE — Telephone Encounter (Signed)
Patient called back and LVM for Christal to call him back @ [phone]

## 2023-04-23 NOTE — Telephone Encounter (Signed)
Left msg for a return call from patient regarding needing more details for referral.

## 2023-05-10 NOTE — Telephone Encounter (Signed)
Re-faxed order; pt aware.

## 2023-05-10 NOTE — Telephone Encounter (Signed)
Patient called and stated [facility] never received referral and he would like this sent.  Patients callback #: [phone]

## 2023-07-03 ENCOUNTER — Encounter: Payer: Self-pay | Admitting: Urology

## 2024-10-19 ENCOUNTER — Other Ambulatory Visit: Payer: Self-pay

## 2024-10-19 ENCOUNTER — Inpatient Hospital Stay: Admission: RE | Admit: 2024-10-19 | Discharge: 2024-10-19 | Attending: Family Medicine

## 2024-10-19 VITALS — BP 129/85 | HR 53 | Temp 97.8°F | Resp 20

## 2024-10-19 DIAGNOSIS — J4521 Mild intermittent asthma with (acute) exacerbation: Secondary | ICD-10-CM

## 2024-10-19 DIAGNOSIS — J3089 Other allergic rhinitis: Secondary | ICD-10-CM

## 2024-10-19 MED ORDER — PROMETHAZINE-DM 6.25-15 MG/5ML PO SYRP: 5.0000 mL | ORAL_SOLUTION | Freq: Four times a day (QID) | ORAL | 0 refills | Status: AC | PRN

## 2024-10-19 MED ORDER — PREDNISONE 20 MG PO TABS: 40.0000 mg | ORAL_TABLET | Freq: Every day | ORAL | 0 refills | Status: DC

## 2024-10-19 MED ORDER — ALBUTEROL SULFATE HFA 108 (90 BASE) MCG/ACT IN AERS: 2.0000 | INHALATION_SPRAY | RESPIRATORY_TRACT | 0 refills | Status: AC | PRN

## 2024-10-19 NOTE — ED Provider Notes (Signed)
 [facility]    CSN: [id number] Arrival date & time: 10/19/24  1355      History   Chief Complaint No chief complaint on file.   HPI [name] is a 73 y.o. male.   Pt reports dental procedure x1 month ago. Pt reports ever since has had productive cough ever since. Pt reports never received abx tablets and only px peridex  oral solution. Pt reports read about side effects of peridex  and wondering if cough was related to the medication or viral exposure.       Past Medical History:  Diagnosis Date  . Gout 10/06/2019    Patient Active Problem List   Diagnosis Date Noted  . Gout 10/06/2019  . 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 Acquired trigger finger 08/20/2013  . Osteoarthritis of left knee 11/06/2012  . Lateral meniscus, posterior horn derangement 10/24/2012  . Knee pain 10/24/2012  . TENDINITIS, LEFT KNEE 09/22/2009    Past Surgical History:  Procedure Laterality Date  . HEMORRHOID SURGERY N/A 07/04/2013   Procedure: HEMORRHOIDECTOMY;  Surgeon: Oneil DELENA Budge, MD;  Location: [facility];  Service: General;  Laterality: N/A;  . HERNIA REPAIR     Dr Renita  . KNEE SURGERY Left    arthroscopy  . TONSILECTOMY/ADENOIDECTOMY WITH MYRINGOTOMY    . VASECTOMY         Home Medications    Prior to Admission medications   Medication Sig Start Date End Date Taking? Authorizing Provider  albuterol (VENTOLIN HFA) 108 (90 Base) MCG/ACT inhaler Inhale 2 puffs into the lungs every 4 (four) hours as needed. 10/19/24  Yes Stuart Vernell Norris, PA-C  predniSONE (DELTASONE) 20 MG tablet Take 2 tablets (40 mg total) by mouth daily with breakfast. 10/19/24  Yes Stuart Vernell Norris, PA-C  promethazine-dextromethorphan (PROMETHAZINE-DM) 6.25-15 MG/5ML syrup Take 5 mLs by mouth 4 (four) times daily as needed. 10/19/24  Yes Stuart Vernell Norris, PA-C  allopurinol (ZYLOPRIM) 300 MG tablet Take 1 tablet by mouth daily. 07/14/19   [provider]  ALPRAZolam  (XANAX ) 1 MG  tablet Take 1 tablet by mouth 1 hour prior to procedure 03/07/22   Stoneking, Adine PARAS., MD  ascorbic acid (VITAMIN C) 1000 MG tablet Take 1,000 mg by mouth daily.    [provider]  Cholecalciferol (VITAMIN D3 PO) Take 10,000 Units by mouth every 3 (three) days.    [provider]  Colchicine 0.6 MG CAPS Take 1 capsule by mouth daily. 09/18/19   [provider]  HYDROcodone -acetaminophen  (NORCO/VICODIN) 5-325 MG tablet Take 1 tablet by mouth every 6 (six) hours as needed for moderate pain. 03/09/22   Stoneking, Adine PARAS., MD  Turmeric Curcumin 500 MG CAPS Take 1 capsule by mouth daily.    [provider]  vitamin B-12 (CYANOCOBALAMIN) 50 MCG tablet Take 50 mcg by mouth daily.    [provider]    Family History Family History  Problem Relation Age of Onset  . Cancer Mother   . Pulmonary disease Mother   . Leukemia Father     Social History Social History   Tobacco Use  . Smoking status: Former    Current packs/day: 0.00    Average packs/day: 2.0 packs/day for 30.0 years (60.0 ttl pk-yrs)    Types: Cigarettes    Start date: 06/30/1972    Quit date: 06/30/2002    Years since quitting: 22.3  . Smokeless tobacco: Never  Vaping Use  . Vaping status: Never Used  Substance Use Topics  .  Alcohol use: No    Comment: clean for 16 years  . Drug use: No    Comment: recovering addict cocaone-amphetamines-clean for 16 years     Allergies   Penicillin g benzathine and Penicillins   Review of Systems Review of Systems   Physical Exam Triage Vital Signs ED Triage Vitals  Encounter Vitals Group     BP 10/19/24 1416 129/85     Girls Systolic BP Percentile --      Girls Diastolic BP Percentile --      Boys Systolic BP Percentile --      Boys Diastolic BP Percentile --      Pulse Rate 10/19/24 1416 (!) 53     Resp 10/19/24 1416 20     Temp 10/19/24 1416 97.8 F (36.6 C)     Temp Source 10/19/24 1416 Oral     SpO2 10/19/24 1416 97 %      Weight --      Height --      Head Circumference --      Peak Flow --      Pain Score 10/19/24 1414 2     Pain Loc --      Pain Education --      Exclude from Growth Chart --    No data found.  Updated Vital Signs BP 129/85 (BP Location: Right Arm)   Pulse (!) 53   Temp 97.8 F (36.6 C) (Oral)   Resp 20   SpO2 97%   Visual Acuity Right Eye Distance:   Left Eye Distance:   Bilateral Distance:    Right Eye Near:   Left Eye Near:    Bilateral Near:     Physical Exam   UC Treatments / Results  Labs (all labs ordered are listed, but only abnormal results are displayed) Labs Reviewed - No data to display  EKG   Radiology No results found.  Procedures Procedures (including critical care time)  Medications Ordered in UC Medications - No data to display  Initial Impression / Assessment and Plan / UC Course  I have reviewed the triage vital signs and the nursing notes.  Pertinent labs & imaging results that were available during my care of the patient were reviewed by me and considered in my medical decision making (see chart for details).     *** Final Clinical Impressions(s) / UC Diagnoses   Final diagnoses:  Seasonal allergic rhinitis due to other allergic trigger  Mild intermittent asthma with acute exacerbation     Discharge Instructions      In addition to the prescribed medications, you may take an antihistamine consistently daily and nasal spray such as Astelin or Flonase twice daily.  Use saline sinus rinses as needed, humidifiers.  Follow-up for worsening or unresolving symptoms   ED Prescriptions     Medication Sig Dispense Auth. Provider   predniSONE (DELTASONE) 20 MG tablet Take 2 tablets (40 mg total) by mouth daily with breakfast. 10 tablet Stuart Vernell Norris, PA-C   albuterol (VENTOLIN HFA) 108 (90 Base) MCG/ACT inhaler Inhale 2 puffs into the lungs every 4 (four) hours as needed. 18 g Stuart Vernell Auburn, PA-C    promethazine-dextromethorphan (PROMETHAZINE-DM) 6.25-15 MG/5ML syrup Take 5 mLs by mouth 4 (four) times daily as needed. 100 mL Stuart Vernell Norris, [place]      PDMP not reviewed this encounter.

## 2024-10-19 NOTE — ED Triage Notes (Signed)
 Pt reports dental procedure x1 month ago. Pt reports ever since has had productive cough ever since. Pt reports never received abx tablets and only px peridex  oral solution. Pt reports read about side effects of peridex  and wondering if cough was related to the medication or viral exposure.

## 2024-10-19 NOTE — Discharge Instructions (Signed)
 In addition to the prescribed medications, you may take an antihistamine consistently daily and nasal spray such as Astelin or Flonase twice daily.  Use saline sinus rinses as needed, humidifiers.  Follow-up for worsening or unresolving symptoms

## 2024-10-20 DIAGNOSIS — M2550 Pain in unspecified joint: Secondary | ICD-10-CM | POA: Insufficient documentation

## 2024-10-20 DIAGNOSIS — Z79899 Other long term (current) drug therapy: Secondary | ICD-10-CM | POA: Insufficient documentation

## 2024-10-24 ENCOUNTER — Other Ambulatory Visit: Payer: Self-pay

## 2024-10-24 ENCOUNTER — Encounter: Payer: Self-pay | Admitting: Orthopedic Surgery

## 2024-10-24 ENCOUNTER — Ambulatory Visit: Admitting: Orthopedic Surgery

## 2024-10-24 VITALS — Wt 195.6 lb

## 2024-10-24 DIAGNOSIS — M19021 Primary osteoarthritis, right elbow: Secondary | ICD-10-CM

## 2024-10-24 DIAGNOSIS — M7021 Olecranon bursitis, right elbow: Secondary | ICD-10-CM | POA: Diagnosis not present

## 2024-10-24 DIAGNOSIS — L03113 Cellulitis of right upper limb: Secondary | ICD-10-CM | POA: Diagnosis not present

## 2024-10-24 MED ORDER — SULFAMETHOXAZOLE-TRIMETHOPRIM 800-160 MG PO TABS: 1.0000 | ORAL_TABLET | Freq: Two times a day (BID) | ORAL | 0 refills | Status: DC

## 2024-10-24 NOTE — Progress Notes (Signed)
 Chief Complaint  Patient presents with   New Patient (Initial Visit)   Elbow Pain   73 year old male history of gout presents with several day history of swelling of his right elbow previously seen in 2019 for similar findings  There is erythema and swelling around the right elbow.  He has a history of recent upper respiratory infection treated with steroids no antibiotics  Focused exam of the right elbow shows a large swollen erythematous area with fluid over the right elbow he has decreased extension which is chronic he has good flexion rotation  No major pain with flexion extension of the elbow  Imaging studies show  DG Elbow 2 Views Right Result Date: 10/24/2024 X-ray report Chief complaint swelling right elbow Images 2 Reading: SFT TISS SWELLING RIGHT ELBOW OA ELBOW JNT Impression: OLE BURSITIS, OA RIGTH ELBOW     Encounter Diagnoses  Name Primary?   Olecranon bursitis, right elbow Yes   Cellulitis of right elbow    Assessment and plan  Swelling of the right elbow could be bursitis could be cellulitis with bursitis or gout  Recommend the patient start antibiotics come back on Monday to determine if we can aspirate.  Concern of aspirating through cellulitis at this point

## 2024-10-27 ENCOUNTER — Ambulatory Visit: Admitting: Orthopedic Surgery

## 2024-10-27 DIAGNOSIS — M7021 Olecranon bursitis, right elbow: Secondary | ICD-10-CM | POA: Diagnosis not present

## 2024-10-27 DIAGNOSIS — M10021 Idiopathic gout, right elbow: Secondary | ICD-10-CM

## 2024-10-27 NOTE — Progress Notes (Signed)
    10/27/2024   Chief Complaint  Patient presents with   Elbow Pain    Right     No diagnosis found.  What pharmacy do you use ? __[facility]_________  DOI/DOS/ Date:    Did you get better, worse or no change (Answer below)   Improved/ still has redness but antibiotics are helping

## 2024-10-27 NOTE — Progress Notes (Signed)
 FOLLOW-UP OFFICE VISIT   Patient: [name]           Date of Birth: 1951-05-27           MRN: [id number] Visit Date: 10/27/2024 Requested by: No referring provider defined for this encounter. PCP: Pcp, No    Encounter Diagnoses  Name Primary?   Olecranon bursitis, right elbow Yes   Acute idiopathic gout of right elbow     Chief Complaint  Patient presents with   Elbow Pain    Right     73 year old male history of gout presented with right elbow olecranon bursitis possible gout versus cellulitis infection started on Bactrim   Swelling is gone down patient feeling better  The area still looks red to me but I think now that we have been on antibiotics we can choose an area where it is the least red and aspirate  The aspiration was performed we got back 15 cc of yellow fluid with gout tophus no evidence of purulence    ASSESSMENT AND PLAN Compression wrap x 5 days recheck 1 week continue Bactrim  Restart allopurinol  Procedure note  Aspiration right elbow  The right olecranon bursa was aspirated with the patient's consent following appropriate timeout  After confirmation of the right elbow as the aspiration site the area was cleaned with alcohol and ethyl chloride was used to anesthetize the area.  We chose the area of least redness and erythema and used an 18-gauge needle to draw out 15 cc of yellow fluid with gout tophi and it  Compression wrap placed

## 2024-11-03 ENCOUNTER — Ambulatory Visit: Admitting: Orthopedic Surgery

## 2024-11-03 DIAGNOSIS — L03113 Cellulitis of right upper limb: Secondary | ICD-10-CM

## 2024-11-03 DIAGNOSIS — M7021 Olecranon bursitis, right elbow: Secondary | ICD-10-CM | POA: Diagnosis not present

## 2024-11-03 DIAGNOSIS — M10021 Idiopathic gout, right elbow: Secondary | ICD-10-CM | POA: Diagnosis not present

## 2024-11-03 NOTE — Progress Notes (Signed)
    11/03/2024   Chief Complaint  Patient presents with   Follow-up    Recheck on right elbow  Right elbow  Encounter Diagnoses  Name Primary?   Olecranon bursitis, right elbow Yes   Acute idiopathic gout of right elbow    Cellulitis of right elbow     What pharmacy do you use ? __ [facility]___  DOI/DOS/ Date: N/A  Did you get better, worse or no change (Answer below)   Improved

## 2024-11-03 NOTE — Progress Notes (Signed)
    11/03/2024   Chief Complaint  Patient presents with   Follow-up    Recheck on right elbow    Encounter Diagnoses  Name Primary?   Olecranon bursitis, right elbow Yes   Acute idiopathic gout of right elbow    Cellulitis of right elbow     Did you get better, worse or no change (Answer below)   Improved after aspiration November 10 and antibiotics (Bactrim ) started November 7.  I still see some redness around the elbow and thickening of the bursa with no real fluid.  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

## 2024-11-07 ENCOUNTER — Encounter: Payer: Self-pay | Admitting: Orthopedic Surgery

## 2024-11-07 ENCOUNTER — Ambulatory Visit: Admitting: Orthopedic Surgery

## 2024-11-07 DIAGNOSIS — M7021 Olecranon bursitis, right elbow: Secondary | ICD-10-CM

## 2024-11-07 DIAGNOSIS — L03113 Cellulitis of right upper limb: Secondary | ICD-10-CM | POA: Diagnosis not present

## 2024-11-07 MED ORDER — SULFAMETHOXAZOLE-TRIMETHOPRIM 800-160 MG PO TABS: 1.0000 | ORAL_TABLET | Freq: Two times a day (BID) | ORAL | 0 refills | Status: AC

## 2024-11-07 NOTE — Progress Notes (Signed)
 FOLLOW-UP OFFICE VISIT   Patient: [name]           Date of Birth: [birth year]           MRN: [id number] Visit Date: 11/07/2024 Requested by: No referring provider defined for this encounter. PCP: Pcp, No    Encounter Diagnoses  Name Primary?   Olecranon bursitis, right elbow Yes   Cellulitis of right elbow     Chief Complaint  Patient presents with   Elbow Pain    Right    Damyen took his colchicine yesterday  He says his pain is almost completely gone He still has some redness please see the media section  No tenderness  He has regained his range of motion  Thickening over the bursa and the mild fluid accumulation   ASSESSMENT AND PLAN Cellulitis versus gout versus both  Reorder sulfamethoxazole  trimethoprim  and continue colchicine for 5 days follow-up in 2 weeks

## 2024-11-07 NOTE — Progress Notes (Signed)
    11/07/2024   Chief Complaint  Patient presents with   Elbow Pain    Right     No diagnosis found.  What pharmacy do you use ? __[facility]_________  DOI/DOS/ Date:    Did you get better, worse or no change (Answer below)   Improved

## 2024-11-07 NOTE — Patient Instructions (Signed)
 Meds ordered this encounter  Medications   sulfamethoxazole -trimethoprim  (BACTRIM  DS) 800-160 MG tablet    Sig: Take 1 tablet by mouth 2 (two) times daily for 14 days.    Dispense:  28 tablet    Refill:  0   Take colchicine 5 more days

## 2024-11-27 ENCOUNTER — Ambulatory Visit: Admitting: Orthopedic Surgery

## 2024-11-27 ENCOUNTER — Encounter: Payer: Self-pay | Admitting: Orthopedic Surgery

## 2024-11-27 DIAGNOSIS — L03113 Cellulitis of right upper limb: Secondary | ICD-10-CM | POA: Diagnosis not present

## 2024-11-27 NOTE — Progress Notes (Signed)
° ° °  11/27/2024   Chief Complaint  Patient presents with   Elbow Pain    Right     Encounter Diagnosis  Name Primary?   Cellulitis of right elbow Yes    What pharmacy do you use ? __[facility]_________  DOI/DOS/ Date:    Did you get better, worse or no change (Answer below)   Improved/ much better

## 2024-11-27 NOTE — Progress Notes (Signed)
 FOLLOW-UP OFFICE VISIT   Patient: [name]           Date of Birth: Nov 20, 1951           MRN: [id number] Visit Date: 11/27/2024 Requested by: No referring provider defined for this encounter. PCP: Pcp, No    Encounter Diagnosis  Name Primary?   Cellulitis of right elbow Yes    Chief Complaint  Patient presents with   Elbow Pain    Right     73 year old male with gout rule out cellulitis we started treating him in November for olecranon bursitis/gout cellulitis  We aspirated the elbow we put him on antibiotics Bactrim  and has restarted his gout medication allopurinol and suggested colchicine as well  He is here for checkup  The elbow has returned to its normal state  ASSESSMENT AND PLAN Probably a combination of cellulitis bursitis and gout  Resolution of symptoms patient has returned to normal follow-up as needed

## 2025-01-16 ENCOUNTER — Emergency Department (HOSPITAL_COMMUNITY)
Admission: EM | Admit: 2025-01-16 | Discharge: 2025-01-17 | Disposition: A | Attending: Emergency Medicine | Admitting: Emergency Medicine

## 2025-01-16 ENCOUNTER — Encounter (HOSPITAL_COMMUNITY): Payer: Self-pay

## 2025-01-16 DIAGNOSIS — M542 Cervicalgia: Secondary | ICD-10-CM | POA: Insufficient documentation

## 2025-01-16 DIAGNOSIS — S161XXA Strain of muscle, fascia and tendon at neck level, initial encounter: Secondary | ICD-10-CM

## 2025-01-16 DIAGNOSIS — Y9301 Activity, walking, marching and hiking: Secondary | ICD-10-CM | POA: Diagnosis not present

## 2025-01-16 DIAGNOSIS — W000XXA Fall on same level due to ice and snow, initial encounter: Secondary | ICD-10-CM | POA: Diagnosis not present

## 2025-01-16 DIAGNOSIS — S0990XA Unspecified injury of head, initial encounter: Secondary | ICD-10-CM | POA: Insufficient documentation

## 2025-01-16 DIAGNOSIS — W19XXXA Unspecified fall, initial encounter: Secondary | ICD-10-CM

## 2025-01-16 NOTE — ED Triage Notes (Signed)
 Pt comes in after a fall. Pt fell backward onto the concrete hitting head. Pt doesn't have head pain but has neck pain. A&Ox4.

## 2025-01-17 ENCOUNTER — Emergency Department (HOSPITAL_COMMUNITY)

## 2025-01-17 NOTE — ED Provider Notes (Signed)
 " [place] [facility] AT [facility] Provider Note   CSN: [id number] Arrival date & time: 01/16/25  2159     Patient presents with: Fall (Neck pain, hit head but not hurting )   [name] is a 74 y.o. male.   Patient is a 74 year old male with history of gout.  Patient presenting today for evaluation of a fall.  He was walking down his driveway when he slipped on ice and his feet came out from underneath him.  He then landed on the back of his head.  He denies loss of consciousness, but states that he was disoriented for a short period of time.  He is also complaining of neck discomfort.  He denies any weakness or numbness.       Prior to Admission medications  Medication Sig Start Date End Date Taking? Authorizing Provider  albuterol  (VENTOLIN  HFA) 108 (90 Base) MCG/ACT inhaler Inhale 2 puffs into the lungs every 4 (four) hours as needed. 10/19/24   Stuart Vernell Norris, PA-C  allopurinol (ZYLOPRIM) 300 MG tablet Take 1 tablet by mouth daily. Patient not taking: Reported on 11/03/2024 07/14/19   [provider]  ALPRAZolam  (XANAX ) 1 MG tablet Take 1 tablet by mouth 1 hour prior to procedure 03/07/22   Stoneking, Adine PARAS., MD  ascorbic acid (VITAMIN C) 1000 MG tablet Take 1,000 mg by mouth daily.    [provider]  Cholecalciferol (VITAMIN D3 PO) Take 10,000 Units by mouth every 3 (three) days.    [provider]  Colchicine 0.6 MG CAPS Take 1 capsule by mouth daily. Patient not taking: Reported on 11/03/2024 09/18/19   [provider]  HYDROcodone -acetaminophen  (NORCO/VICODIN) 5-325 MG tablet Take 1 tablet by mouth every 6 (six) hours as needed for moderate pain. 03/09/22   Stoneking, Adine PARAS., MD  promethazine -dextromethorphan (PROMETHAZINE -DM) 6.25-15 MG/5ML syrup Take 5 mLs by mouth 4 (four) times daily as needed. 10/19/24   Stuart Vernell Norris, PA-C  Turmeric Curcumin 500 MG CAPS Take 1 capsule by mouth daily.     [provider]  vitamin B-12 (CYANOCOBALAMIN) 50 MCG tablet Take 50 mcg by mouth daily.    [provider]    Allergies: Penicillin g benzathine and Penicillins    Review of Systems  All other systems reviewed and are negative.   Updated Vital Signs BP (!) 143/84   Pulse (!) 51   Temp 98.1 F (36.7 C) (Oral)   Resp 14   SpO2 97%   Physical Exam Vitals and nursing note reviewed.  Constitutional:      Appearance: Normal appearance.  HENT:     Head: Normocephalic and atraumatic.  Eyes:     Extraocular Movements: Extraocular movements intact.     Pupils: Pupils are equal, round, and reactive to light.  Neck:     Comments: There is tenderness to palpation in the soft tissues of the upper cervical region.  There is no bony tenderness or step-off.  He does have some discomfort with range of motion. Neurological:     General: No focal deficit present.     Mental Status: He is alert and oriented to person, place, and time. Mental status is at baseline.     Cranial Nerves: No cranial nerve deficit.     Motor: No weakness.     Gait: Gait normal.     (all labs ordered are listed, but only abnormal results are displayed) Labs Reviewed - No data to display  EKG: None  Radiology: No results found.   Procedures   Medications Ordered in the ED - No data to display                                  Medical Decision Making Amount and/or Complexity of Data Reviewed Radiology: ordered.   Patient is a 74 year old male presenting after a fall.  He slipped on ice and struck the back of his head.  He is having discomfort in his head and neck.  He arrives here with stable vital signs and is afebrile.  CT scan of the cervical spine and head obtained showing no acute traumatic injury.  Patient appropriate for discharge.  He is to rest and follow-up as needed for any problems.     Final diagnoses:  None    ED Discharge Orders     None           Geroldine Berg, MD 01/17/25 0501  "

## 2025-01-17 NOTE — ED Notes (Signed)
 ED Provider at bedside.

## 2025-01-17 NOTE — Discharge Instructions (Signed)
 Take ibuprofen 600 mg every 6 hours as needed for pain.  Follow-up with primary doctor if symptoms persist, and return to the ER if you develop any new and/or concerning issues.
